# Patient Record
Sex: Female | Born: 1940 | Race: White | Hispanic: No | Smoking: Former smoker
Health system: Southern US, Community
[De-identification: ages and names within clinical notes are randomized; demographics above are authoritative.]

## PROBLEM LIST (undated history)

## (undated) DIAGNOSIS — H269 Unspecified cataract: Secondary | ICD-10-CM

## (undated) DIAGNOSIS — N189 Chronic kidney disease, unspecified: Secondary | ICD-10-CM

## (undated) DIAGNOSIS — I499 Cardiac arrhythmia, unspecified: Secondary | ICD-10-CM

## (undated) DIAGNOSIS — I10 Essential (primary) hypertension: Secondary | ICD-10-CM

## (undated) DIAGNOSIS — R112 Nausea with vomiting, unspecified: Secondary | ICD-10-CM

## (undated) DIAGNOSIS — E785 Hyperlipidemia, unspecified: Secondary | ICD-10-CM

## (undated) DIAGNOSIS — M1990 Unspecified osteoarthritis, unspecified site: Secondary | ICD-10-CM

## (undated) DIAGNOSIS — Z8719 Personal history of other diseases of the digestive system: Secondary | ICD-10-CM

## (undated) DIAGNOSIS — K219 Gastro-esophageal reflux disease without esophagitis: Secondary | ICD-10-CM

## (undated) DIAGNOSIS — T7840XA Allergy, unspecified, initial encounter: Secondary | ICD-10-CM

## (undated) DIAGNOSIS — Z87442 Personal history of urinary calculi: Secondary | ICD-10-CM

## (undated) DIAGNOSIS — Z9889 Other specified postprocedural states: Secondary | ICD-10-CM

## (undated) HISTORY — DX: Unspecified cataract: H26.9

## (undated) HISTORY — DX: Allergy, unspecified, initial encounter: T78.40XA

## (undated) HISTORY — DX: Essential (primary) hypertension: I10

## (undated) HISTORY — PX: BREAST BIOPSY: SHX20

## (undated) HISTORY — DX: Unspecified osteoarthritis, unspecified site: M19.90

## (undated) HISTORY — DX: Hyperlipidemia, unspecified: E78.5

## (undated) HISTORY — PX: ABDOMINAL HYSTERECTOMY: SHX81

## (undated) HISTORY — PX: REDUCTION MAMMAPLASTY: SUR839

## (undated) HISTORY — PX: BREAST EXCISIONAL BIOPSY: SUR124

---

## 1968-10-29 HISTORY — PX: REDUCTION MAMMAPLASTY: SUR839

## 1998-10-29 HISTORY — PX: BREAST EXCISIONAL BIOPSY: SUR124

## 2013-10-29 HISTORY — PX: APPENDECTOMY: SHX54

## 2018-10-29 HISTORY — PX: BREAST BIOPSY: SHX20

## 2021-12-28 ENCOUNTER — Encounter: Payer: Self-pay | Admitting: Internal Medicine

## 2021-12-28 ENCOUNTER — Other Ambulatory Visit: Payer: Self-pay

## 2021-12-28 ENCOUNTER — Ambulatory Visit (INDEPENDENT_AMBULATORY_CARE_PROVIDER_SITE_OTHER): Payer: Medicare Other | Admitting: Internal Medicine

## 2021-12-28 VITALS — BP 176/98 | HR 73 | Temp 97.9°F | Ht 65.0 in | Wt 136.0 lb

## 2021-12-28 DIAGNOSIS — I10 Essential (primary) hypertension: Secondary | ICD-10-CM | POA: Diagnosis not present

## 2021-12-28 DIAGNOSIS — J0100 Acute maxillary sinusitis, unspecified: Secondary | ICD-10-CM | POA: Diagnosis not present

## 2021-12-28 MED ORDER — INDAPAMIDE 1.25 MG PO TABS: 1.2500 mg | ORAL_TABLET | Freq: Every day | ORAL | 0 refills | Status: DC

## 2021-12-28 MED ORDER — OLMESARTAN MEDOXOMIL 20 MG PO TABS: 20.0000 mg | ORAL_TABLET | Freq: Every day | ORAL | 0 refills | Status: DC

## 2021-12-28 MED ORDER — METOPROLOL SUCCINATE 50 MG PO CS24: 1.0000 | EXTENDED_RELEASE_CAPSULE | Freq: Two times a day (BID) | ORAL | 0 refills | Status: DC

## 2021-12-28 MED ORDER — AMOXICILLIN-POT CLAVULANATE 875-125 MG PO TABS: 1.0000 | ORAL_TABLET | Freq: Two times a day (BID) | ORAL | 0 refills | Status: AC

## 2021-12-28 MED ORDER — AMLODIPINE BESYLATE 5 MG PO TABS: 5.0000 mg | ORAL_TABLET | Freq: Every day | ORAL | 0 refills | Status: DC

## 2021-12-28 NOTE — Progress Notes (Signed)
? ?Subjective:  ?Patient ID: Angel Kim, female    DOB: 1940-12-18  Age: 81 y.o. MRN: [id number] ? ?CC: Hypertension and Sinusitis ? ?This visit occurred during the SARS-CoV-2 public health emergency.  Safety protocols were in place, including screening questions prior to the visit, additional usage of staff PPE, and extensive cleaning of exam room while observing appropriate contact time as indicated for disinfecting solutions.   ? ?HPI ?Angel Kim presents for establishing. ? ?She moved from [facility] to the [place] area 3 months ago.  She tells me that she had labs done 3 months ago that were all okay.  She has no information with her today.  She complains of a 2-week history of thick, yellow, nasal phlegm with cough productive of yellow phlegm.  She denies headache, blurred vision, chest pain, shortness of breath, night sweats, fever, chills, or diaphoresis.  For blood pressure control she is taking metoprolol, losartan, and HCTZ as needed.  Amlodipine has been prescribed but she tells me she is not taking it. ? ?History ?Angel Kim has a past medical history of Arthritis and Hypertension.  ? ?She has a past surgical history that includes Abdominal hysterectomy.  ? ?Her family history includes Cancer in her sister; Coronary artery disease in her sister; Diabetes in her father and mother; Early death in her brother; Healthy in her sister; Heart attack in her father, mother, sister, and sister; Heart disease in her father, mother, and sister; Kidney failure in her sister.She reports that she has quit smoking. Her smoking use included cigarettes. She has never used smokeless tobacco. She reports current alcohol use. She reports that she does not use drugs. ? ?Outpatient Medications Prior to Visit  ?Medication Sig Dispense Refill  ? atorvastatin (LIPITOR) 40 MG tablet     ? estradiol (ESTRACE) 0.5 MG tablet     ? Fexofenadine HCl (ALLEGRA ALLERGY PO) Take by mouth.    ? Meloxicam 15 MG TBDP     ?  hydrochlorothiazide (HYDRODIURIL) 25 MG tablet     ? losartan (COZAAR) 50 MG tablet     ? Metoprolol Succinate 50 MG CS24     ? ?No facility-administered medications prior to visit.  ? ? ?ROS ?Review of Systems  ?Constitutional:  Negative for chills, diaphoresis, fatigue and fever.  ?HENT:  Positive for rhinorrhea, sinus pressure and sinus pain. Negative for sore throat and trouble swallowing.   ?Respiratory:  Negative for cough, chest tightness, shortness of breath and wheezing.   ?Cardiovascular:  Negative for chest pain, palpitations and leg swelling.  ?Gastrointestinal:  Negative for abdominal pain, constipation, diarrhea, nausea and vomiting.  ?Genitourinary: Negative.  Negative for difficulty urinating and dysuria.  ?Musculoskeletal: Negative.   ?Skin: Negative.  Negative for color change.  ?Neurological:  Negative for dizziness, weakness and headaches.  ?Hematological:  Negative for adenopathy. Does not bruise/bleed easily.  ?Psychiatric/Behavioral: Negative.    ? ?Objective:  ?BP (!) 176/98 (BP Location: Left Arm, Patient Position: Sitting, Cuff Size: Normal)   Pulse 73   Temp 97.9 ?F (36.6 ?C) (Oral)   Ht 5\' 5"  (1.651 m)   Wt 136 lb (61.7 kg)   SpO2 97%   BMI 22.63 kg/m?  ? ?Physical Exam ?Vitals reviewed.  ?Constitutional:   ?   General: She is not in acute distress. ?   Appearance: She is not ill-appearing, toxic-appearing or diaphoretic.  ?HENT:  ?   Nose: Nose normal.  ?   Mouth/Throat:  ?   Mouth: Mucous membranes are  moist.  ?Eyes:  ?   General: No scleral icterus. ?   Conjunctiva/sclera: Conjunctivae normal.  ?Cardiovascular:  ?   Rate and Rhythm: Normal rate and regular rhythm.  ?   Heart sounds: No murmur heard. ?Pulmonary:  ?   Effort: Pulmonary effort is normal.  ?   Breath sounds: No stridor. No wheezing, rhonchi or rales.  ?Abdominal:  ?   General: Abdomen is flat.  ?   Palpations: There is no mass.  ?   Tenderness: There is no abdominal tenderness. There is no guarding.  ?   Hernia: No  hernia is present.  ?Musculoskeletal:     ?   General: Normal range of motion.  ?   Cervical back: Neck supple.  ?   Right lower leg: No edema.  ?   Left lower leg: No edema.  ?Lymphadenopathy:  ?   Cervical: No cervical adenopathy.  ?Skin: ?   General: Skin is warm and dry.  ?Neurological:  ?   General: No focal deficit present.  ?   Mental Status: She is alert.  ?Psychiatric:     ?   Mood and Affect: Mood normal.     ?   Behavior: Behavior normal.  ? ? ?No results found for: WBC, HGB, HCT, PLT, GLUCOSE, CHOL, TRIG, HDL, LDLDIRECT, LDLCALC, ALT, AST, NA, K, CL, CREATININE, BUN, CO2, TSH, PSA, INR, GLUF, HGBA1C, MICROALBUR  ? ?Assessment & Plan:  ? ?Angel Kim was seen today for hypertension and sinusitis. ? ?Diagnoses and all orders for this visit: ? ?Subacute maxillary sinusitis- Will treat with a broad spectrum antibiotic. ?-     amoxicillin-clavulanate (AUGMENTIN) 875-125 MG tablet; Take 1 tablet by mouth 2 (two) times daily for 10 days. ? ?Primary hypertension- Her BP is not at goal. Will make some changes. She will get info from her previous PCP. ?-     amLODipine (NORVASC) 5 MG tablet; Take 1 tablet (5 mg total) by mouth daily. ?-     indapamide (LOZOL) 1.25 MG tablet; Take 1 tablet (1.25 mg total) by mouth daily. ?-     Metoprolol Succinate 50 MG CS24; Take 1 tablet by mouth 2 (two) times daily. ?-     olmesartan (BENICAR) 20 MG tablet; Take 1 tablet (20 mg total) by mouth daily. ? ? ?I have discontinued Angel Kim's hydrochlorothiazide and losartan. I have also changed her Metoprolol Succinate. Additionally, I am having her start on amoxicillin-clavulanate, amLODipine, indapamide, and olmesartan. Lastly, I am having her maintain her atorvastatin, estradiol, Meloxicam, and Fexofenadine HCl (ALLEGRA ALLERGY PO). ? ?Meds ordered this encounter  ?Medications  ? amoxicillin-clavulanate (AUGMENTIN) 875-125 MG tablet  ?  Sig: Take 1 tablet by mouth 2 (two) times daily for 10 days.  ?  Dispense:  20 tablet  ?  Refill:   0  ? amLODipine (NORVASC) 5 MG tablet  ?  Sig: Take 1 tablet (5 mg total) by mouth daily.  ?  Dispense:  90 tablet  ?  Refill:  0  ? indapamide (LOZOL) 1.25 MG tablet  ?  Sig: Take 1 tablet (1.25 mg total) by mouth daily.  ?  Dispense:  90 tablet  ?  Refill:  0  ? Metoprolol Succinate 50 MG CS24  ?  Sig: Take 1 tablet by mouth 2 (two) times daily.  ?  Dispense:  180 capsule  ?  Refill:  0  ? olmesartan (BENICAR) 20 MG tablet  ?  Sig: Take 1 tablet (20 mg total)  by mouth daily.  ?  Dispense:  90 tablet  ?  Refill:  0  ? ? ? ?Follow-up: Return in about 3 months (around 03/30/2022). ? ?Sanda Linger, MD ?

## 2021-12-28 NOTE — Patient Instructions (Signed)

## 2021-12-29 ENCOUNTER — Encounter: Payer: Self-pay | Admitting: Internal Medicine

## 2022-01-04 ENCOUNTER — Other Ambulatory Visit: Payer: Self-pay | Admitting: Internal Medicine

## 2022-01-04 ENCOUNTER — Other Ambulatory Visit: Payer: Self-pay

## 2022-01-04 DIAGNOSIS — M159 Polyosteoarthritis, unspecified: Secondary | ICD-10-CM

## 2022-01-04 DIAGNOSIS — N952 Postmenopausal atrophic vaginitis: Secondary | ICD-10-CM

## 2022-01-04 MED ORDER — ESTRADIOL 0.5 MG PO TABS
0.5000 mg | ORAL_TABLET | Freq: Every day | ORAL | 0 refills | Status: DC
  Filled 2022-01-04 – 2022-03-07 (×2): qty 90, 90d supply, fill #0

## 2022-01-04 MED ORDER — MELOXICAM 15 MG PO TBDP
1.0000 | ORAL_TABLET | Freq: Every day | ORAL | 0 refills | Status: DC
  Filled 2022-01-04: qty 90, fill #0

## 2022-02-15 ENCOUNTER — Encounter: Payer: Self-pay | Admitting: Internal Medicine

## 2022-02-15 ENCOUNTER — Other Ambulatory Visit: Payer: Self-pay | Admitting: Internal Medicine

## 2022-02-15 ENCOUNTER — Ambulatory Visit (INDEPENDENT_AMBULATORY_CARE_PROVIDER_SITE_OTHER): Payer: Medicare Other | Admitting: Internal Medicine

## 2022-02-15 VITALS — BP 142/82 | HR 63 | Temp 97.6°F | Ht 65.0 in | Wt 136.0 lb

## 2022-02-15 DIAGNOSIS — M1812 Unilateral primary osteoarthritis of first carpometacarpal joint, left hand: Secondary | ICD-10-CM | POA: Diagnosis not present

## 2022-02-15 DIAGNOSIS — E785 Hyperlipidemia, unspecified: Secondary | ICD-10-CM | POA: Insufficient documentation

## 2022-02-15 DIAGNOSIS — I10 Essential (primary) hypertension: Secondary | ICD-10-CM

## 2022-02-15 DIAGNOSIS — N1832 Chronic kidney disease, stage 3b: Secondary | ICD-10-CM | POA: Diagnosis not present

## 2022-02-15 DIAGNOSIS — R3129 Other microscopic hematuria: Secondary | ICD-10-CM

## 2022-02-15 LAB — CBC WITH DIFFERENTIAL/PLATELET
Basophils Absolute: 0.1 10*3/uL (ref 0.0–0.1)
Basophils Relative: 1.2 % (ref 0.0–3.0)
Eosinophils Absolute: 0.5 10*3/uL (ref 0.0–0.7)
Eosinophils Relative: 6 % — ABNORMAL HIGH (ref 0.0–5.0)
HCT: 39.9 % (ref 36.0–46.0)
Hemoglobin: 13.6 g/dL (ref 12.0–15.0)
Lymphocytes Relative: 28.7 % (ref 12.0–46.0)
Lymphs Abs: 2.2 10*3/uL (ref 0.7–4.0)
MCHC: 34.2 g/dL (ref 30.0–36.0)
MCV: 85.2 fl (ref 78.0–100.0)
Monocytes Absolute: 0.6 10*3/uL (ref 0.1–1.0)
Monocytes Relative: 7.9 % (ref 3.0–12.0)
Neutro Abs: 4.3 10*3/uL (ref 1.4–7.7)
Neutrophils Relative %: 56.2 % (ref 43.0–77.0)
Platelets: 227 10*3/uL (ref 150.0–400.0)
RBC: 4.68 Mil/uL (ref 3.87–5.11)
RDW: 13.7 % (ref 11.5–15.5)
WBC: 7.7 10*3/uL (ref 4.0–10.5)

## 2022-02-15 LAB — LIPID PANEL
Cholesterol: 179 mg/dL (ref 0–200)
HDL: 67.3 mg/dL (ref >39.00)
LDL Cholesterol: 94 mg/dL (ref 0–99)
NonHDL: 111.61
Total CHOL/HDL Ratio: 3
Triglycerides: 89 mg/dL (ref 0.0–149.0)
VLDL: 17.8 mg/dL (ref 0.0–40.0)

## 2022-02-15 LAB — BASIC METABOLIC PANEL
BUN: 38 mg/dL — ABNORMAL HIGH (ref 6–23)
CO2: 30 mEq/L (ref 19–32)
Calcium: 9.9 mg/dL (ref 8.4–10.5)
Chloride: 104 mEq/L (ref 96–112)
Creatinine, Ser: 0.97 mg/dL (ref 0.40–1.20)
GFR: 55.81 mL/min — ABNORMAL LOW (ref >60.00)
Glucose, Bld: 100 mg/dL — ABNORMAL HIGH (ref 70–99)
Potassium: 4.3 mEq/L (ref 3.5–5.1)
Sodium: 139 mEq/L (ref 135–145)

## 2022-02-15 LAB — URINALYSIS, ROUTINE W REFLEX MICROSCOPIC
Bilirubin Urine: NEGATIVE
Ketones, ur: NEGATIVE
Nitrite: NEGATIVE
Specific Gravity, Urine: 1.01 (ref 1.000–1.030)
Total Protein, Urine: NEGATIVE
Urine Glucose: NEGATIVE
Urobilinogen, UA: 0.2 (ref 0.0–1.0)
pH: 6 (ref 5.0–8.0)

## 2022-02-15 LAB — TSH: TSH: 2.15 u[IU]/mL (ref 0.35–5.50)

## 2022-02-15 LAB — HEPATIC FUNCTION PANEL
ALT: 25 U/L (ref 0–35)
AST: 22 U/L (ref 0–37)
Albumin: 4.4 g/dL (ref 3.5–5.2)
Alkaline Phosphatase: 66 U/L (ref 39–117)
Bilirubin, Direct: 0.1 mg/dL (ref 0.0–0.3)
Total Bilirubin: 0.5 mg/dL (ref 0.2–1.2)
Total Protein: 7.3 g/dL (ref 6.0–8.3)

## 2022-02-15 NOTE — Patient Instructions (Signed)
Hypertension, Adult High blood pressure (hypertension) is when the force of blood pumping through the arteries is too strong. The arteries are the blood vessels that carry blood from the heart throughout the body. Hypertension forces the heart to work harder to pump blood and may cause arteries to become narrow or stiff. Untreated or uncontrolled hypertension can lead to a heart attack, heart failure, a stroke, kidney disease, and other problems. A blood pressure reading consists of a higher number over a lower number. Ideally, your blood pressure should be below 120/80. The first ("top") number is called the systolic pressure. It is a measure of the pressure in your arteries as your heart beats. The second ("bottom") number is called the diastolic pressure. It is a measure of the pressure in your arteries as the heart relaxes. What are the causes? The exact cause of this condition is not known. There are some conditions that result in high blood pressure. What increases the risk? Certain factors may make you more likely to develop high blood pressure. Some of these risk factors are under your control, including: Smoking. Not getting enough exercise or physical activity. Being overweight. Having too much fat, sugar, calories, or salt (sodium) in your diet. Drinking too much alcohol. Other risk factors include: Having a personal history of heart disease, diabetes, high cholesterol, or kidney disease. Stress. Having a family history of high blood pressure and high cholesterol. Having obstructive sleep apnea. Age. The risk increases with age. What are the signs or symptoms? High blood pressure may not cause symptoms. Very high blood pressure (hypertensive crisis) may cause: Headache. Fast or irregular heartbeats (palpitations). Shortness of breath. Nosebleed. Nausea and vomiting. Vision changes. Severe chest pain, dizziness, and seizures. How is this diagnosed? This condition is diagnosed by  measuring your blood pressure while you are seated, with your arm resting on a flat surface, your legs uncrossed, and your feet flat on the floor. The cuff of the blood pressure monitor will be placed directly against the skin of your upper arm at the level of your heart. Blood pressure should be measured at least twice using the same arm. Certain conditions can cause a difference in blood pressure between your right and left arms. If you have a high blood pressure reading during one visit or you have normal blood pressure with other risk factors, you may be asked to: Return on a different day to have your blood pressure checked again. Monitor your blood pressure at home for 1 week or longer. If you are diagnosed with hypertension, you may have other blood or imaging tests to help your health care provider understand your overall risk for other conditions. How is this treated? This condition is treated by making healthy lifestyle changes, such as eating healthy foods, exercising more, and reducing your alcohol intake. You may be referred for counseling on a healthy diet and physical activity. Your health care provider may prescribe medicine if lifestyle changes are not enough to get your blood pressure under control and if: Your systolic blood pressure is above 130. Your diastolic blood pressure is above 80. Your personal target blood pressure may vary depending on your medical conditions, your age, and other factors. Follow these instructions at home: Eating and drinking  Eat a diet that is high in fiber and potassium, and low in sodium, added sugar, and fat. An example of this eating plan is called the DASH diet. DASH stands for Dietary Approaches to Stop Hypertension. To eat this way: Eat   plenty of fresh fruits and vegetables. Try to fill one half of your plate at each meal with fruits and vegetables. Eat whole grains, such as whole-wheat pasta, brown rice, or whole-grain bread. Fill about one  fourth of your plate with whole grains. Eat or drink low-fat dairy products, such as skim milk or low-fat yogurt. Avoid fatty cuts of meat, processed or cured meats, and poultry with skin. Fill about one fourth of your plate with lean proteins, such as fish, chicken without skin, beans, eggs, or tofu. Avoid pre-made and processed foods. These tend to be higher in sodium, added sugar, and fat. Reduce your daily sodium intake. Many people with hypertension should eat less than 1,500 mg of sodium a day. Do not drink alcohol if: Your health care provider tells you not to drink. You are pregnant, may be pregnant, or are planning to become pregnant. If you drink alcohol: Limit how much you have to: 0-1 drink a day for women. 0-2 drinks a day for men. Know how much alcohol is in your drink. In the [place] one drink equals one 12 oz bottle of beer (355 mL), one 5 oz glass of wine (148 mL), or one 1 oz glass of hard liquor (44 mL). Lifestyle  Work with your health care provider to maintain a healthy body weight or to lose weight. Ask what an ideal weight is for you. Get at least 30 minutes of exercise that causes your heart to beat faster (aerobic exercise) most days of the week. Activities may include walking, swimming, or biking. Include exercise to strengthen your muscles (resistance exercise), such as Pilates or lifting weights, as part of your weekly exercise routine. Try to do these types of exercises for 30 minutes at least 3 days a week. Do not use any products that contain nicotine or tobacco. These products include cigarettes, chewing tobacco, and vaping devices, such as e-cigarettes. If you need help quitting, ask your health care provider. Monitor your blood pressure at home as told by your health care provider. Keep all follow-up visits. This is important. Medicines Take over-the-counter and prescription medicines only as told by your health care provider. Follow directions carefully. Blood  pressure medicines must be taken as prescribed. Do not skip doses of blood pressure medicine. Doing this puts you at risk for problems and can make the medicine less effective. Ask your health care provider about side effects or reactions to medicines that you should watch for. Contact a health care provider if you: Think you are having a reaction to a medicine you are taking. Have headaches that keep coming back (recurring). Feel dizzy. Have swelling in your ankles. Have trouble with your vision. Get help right away if you: Develop a severe headache or confusion. Have unusual weakness or numbness. Feel faint. Have severe pain in your chest or abdomen. Vomit repeatedly. Have trouble breathing. These symptoms may be an emergency. Get help right away. Call 911. Do not wait to see if the symptoms will go away. Do not drive yourself to the hospital. Summary Hypertension is when the force of blood pumping through your arteries is too strong. If this condition is not controlled, it may put you at risk for serious complications. Your personal target blood pressure may vary depending on your medical conditions, your age, and other factors. For most people, a normal blood pressure is less than 120/80. Hypertension is treated with lifestyle changes, medicines, or a combination of both. Lifestyle changes include losing weight, eating a healthy,   low-sodium diet, exercising more, and limiting alcohol. This information is not intended to replace advice given to you by your health care provider. Make sure you discuss any questions you have with your health care provider. Document Revised: 08/22/2021 Document Reviewed: 08/22/2021 Elsevier Patient Education  2023 [employer]  

## 2022-02-15 NOTE — Progress Notes (Signed)
? ?Subjective:  ?Patient ID: Angel Kim, female    DOB: Jan 09, 1941  Age: 81 y.o. MRN: [id number] ? ?CC: Hypertension and Osteoarthritis ? ? ?HPI ?Angel Kim presents for f/up -  ? ?She is active and denies DOE, SOB, CP, edema. ? ?Outpatient Medications Prior to Visit  ?Medication Sig Dispense Refill  ? amLODipine (NORVASC) 5 MG tablet Take 1 tablet (5 mg total) by mouth daily. 90 tablet 0  ? estradiol (ESTRACE) 0.5 MG tablet Take 1 tablet (0.5 mg total) by mouth daily. 90 tablet 0  ? Fexofenadine HCl (ALLEGRA ALLERGY PO) Take by mouth.    ? indapamide (LOZOL) 1.25 MG tablet Take 1 tablet (1.25 mg total) by mouth daily. 90 tablet 0  ? Metoprolol Succinate 50 MG CS24 Take 1 tablet by mouth 2 (two) times daily. 180 capsule 0  ? olmesartan (BENICAR) 20 MG tablet Take 1 tablet (20 mg total) by mouth daily. 90 tablet 0  ? atorvastatin (LIPITOR) 40 MG tablet     ? Meloxicam 15 MG TBDP Take 1 tablet by mouth daily. 90 tablet 0  ? ?No facility-administered medications prior to visit.  ? ? ?ROS ?Review of Systems  ?Constitutional: Negative.  Negative for diaphoresis and fatigue.  ?HENT: Negative.    ?Eyes: Negative.   ?Respiratory:  Negative for cough, chest tightness, shortness of breath and wheezing.   ?Cardiovascular:  Negative for chest pain, palpitations and leg swelling.  ?Gastrointestinal:  Negative for abdominal pain, constipation, diarrhea, nausea and vomiting.  ?Endocrine: Negative.   ?Genitourinary: Negative.  Negative for dysuria and hematuria.  ?Musculoskeletal:  Positive for arthralgias. Negative for back pain.  ?Skin: Negative.   ?Neurological:  Negative for dizziness, weakness and light-headedness.  ?Hematological:  Negative for adenopathy. Does not bruise/bleed easily.  ?Psychiatric/Behavioral: Negative.    ? ?Objective:  ?BP (!) 142/82 (BP Location: Right Arm)   Pulse 63   Temp 97.6 ?F (36.4 ?C) (Oral)   Ht 5\' 5"  (1.651 m)   Wt 136 lb (61.7 kg)   SpO2 95%   BMI 22.63 kg/m?  ? ?BP Readings from Last  3 Encounters:  ?02/15/22 (!) 142/82  ?12/28/21 (!) 176/98  ? ? ?Wt Readings from Last 3 Encounters:  ?02/15/22 136 lb (61.7 kg)  ?12/28/21 136 lb (61.7 kg)  ? ? ?Physical Exam ?Vitals reviewed.  ?HENT:  ?   Nose: Nose normal.  ?   Mouth/Throat:  ?   Mouth: Mucous membranes are moist.  ?Eyes:  ?   General: No scleral icterus. ?   Conjunctiva/sclera: Conjunctivae normal.  ?Cardiovascular:  ?   Rate and Rhythm: Normal rate and regular rhythm.  ?   Heart sounds: No murmur heard. ?Pulmonary:  ?   Effort: Pulmonary effort is normal.  ?   Breath sounds: No stridor. No wheezing, rhonchi or rales.  ?Abdominal:  ?   General: Abdomen is flat.  ?   Palpations: There is no mass.  ?   Tenderness: There is no abdominal tenderness. There is no guarding.  ?   Hernia: No hernia is present.  ?Musculoskeletal:     ?   General: Deformity present. No swelling.  ?   Cervical back: Neck supple.  ?   Right lower leg: No edema.  ?   Left lower leg: No edema.  ?   Comments: +++ spurring in B 1st CMC joints L>>>R  ?Lymphadenopathy:  ?   Cervical: No cervical adenopathy.  ?Skin: ?   General: Skin is warm and dry.  ?  Findings: No lesion.  ?Neurological:  ?   General: No focal deficit present.  ?   Mental Status: She is alert.  ?Psychiatric:     ?   Mood and Affect: Mood normal.     ?   Behavior: Behavior normal.  ? ? ?Lab Results  ?Component Value Date  ? WBC 7.7 02/15/2022  ? HGB 13.6 02/15/2022  ? HCT 39.9 02/15/2022  ? PLT 227.0 02/15/2022  ? GLUCOSE 100 (H) 02/15/2022  ? CHOL 179 02/15/2022  ? TRIG 89.0 02/15/2022  ? HDL 67.30 02/15/2022  ? [place] 94 02/15/2022  ? ALT 25 02/15/2022  ? AST 22 02/15/2022  ? NA 139 02/15/2022  ? K 4.3 02/15/2022  ? CL 104 02/15/2022  ? CREATININE 0.97 02/15/2022  ? BUN 38 (H) 02/15/2022  ? CO2 30 02/15/2022  ? TSH 2.15 02/15/2022  ? ? ?Patient was never admitted. ? ?Assessment & Plan:  ? ?Sheniyah was seen today for hypertension and osteoarthritis. ? ?Diagnoses and all orders for this visit: ? ?Primary  hypertension- Her BP is well controlled ?-     CBC with Differential/Platelet; Future ?-     Hepatic function panel; Future ?-     TSH; Future ?-     Urinalysis, Routine w reflex microscopic; Future ?-     Basic metabolic panel; Future ?-     Basic metabolic panel ?-     Urinalysis, Routine w reflex microscopic ?-     TSH ?-     Hepatic function panel ?-     CBC with Differential/Platelet ? ?Hyperlipidemia LDL goal <100- LDL goal achieved. Doing well on the statin  ?-     Lipid panel; Future ?-     Hepatic function panel; Future ?-     TSH; Future ?-     TSH ?-     Hepatic function panel ?-     Lipid panel ?-     atorvastatin (LIPITOR) 40 MG tablet; Take 1 tablet (40 mg total) by mouth daily. ? ?Primary osteoarthritis of first carpometacarpal joint of left hand ?-     Ambulatory referral to Orthopedic Surgery ? ?Stage 3b chronic kidney disease ([place])- Will discontinue the nsaid. ?-     CT RENAL STONE STUDY; Future ? ?Hematuria, microscopic- Will evaluate for renal pathology. ?-     CT RENAL STONE STUDY; Future ? ? ?I have discontinued Izora Gala Attig's Meloxicam. I have also changed her atorvastatin. Additionally, I am having her maintain her Fexofenadine HCl (ALLEGRA ALLERGY PO), amLODipine, indapamide, Metoprolol Succinate, olmesartan, and estradiol. ? ?Meds ordered this encounter  ?Medications  ? atorvastatin (LIPITOR) 40 MG tablet  ?  Sig: Take 1 tablet (40 mg total) by mouth daily.  ?  Dispense:  90 tablet  ?  Refill:  1  ? ? ? ?Follow-up: Return in about 6 months (around 08/17/2022). ? ?Scarlette Calico, MD ?

## 2022-02-16 MED ORDER — ATORVASTATIN CALCIUM 40 MG PO TABS: 40.0000 mg | ORAL_TABLET | Freq: Every day | ORAL | 1 refills | Status: DC

## 2022-02-16 NOTE — Telephone Encounter (Signed)
LOV 02/15/22

## 2022-02-19 ENCOUNTER — Other Ambulatory Visit: Payer: Self-pay | Admitting: Internal Medicine

## 2022-02-19 ENCOUNTER — Ambulatory Visit
Admission: RE | Admit: 2022-02-19 | Discharge: 2022-02-19 | Disposition: A | Discharge status: Home / Self Care | Payer: Medicare Other | Source: Ambulatory Visit | Attending: Internal Medicine | Admitting: Internal Medicine

## 2022-02-19 DIAGNOSIS — N1832 Chronic kidney disease, stage 3b: Secondary | ICD-10-CM

## 2022-02-19 DIAGNOSIS — R3129 Other microscopic hematuria: Secondary | ICD-10-CM

## 2022-02-19 DIAGNOSIS — N200 Calculus of kidney: Secondary | ICD-10-CM | POA: Insufficient documentation

## 2022-02-21 ENCOUNTER — Encounter: Payer: Self-pay | Admitting: Internal Medicine

## 2022-03-07 ENCOUNTER — Other Ambulatory Visit: Payer: Self-pay

## 2022-03-07 ENCOUNTER — Ambulatory Visit (INDEPENDENT_AMBULATORY_CARE_PROVIDER_SITE_OTHER): Payer: Medicare Other

## 2022-03-07 DIAGNOSIS — Z0000 Encounter for general adult medical examination without abnormal findings: Secondary | ICD-10-CM | POA: Diagnosis not present

## 2022-03-07 NOTE — Patient Instructions (Signed)
Angel Kim , ?Thank you for taking time to come for your Medicare Wellness Visit. I appreciate your ongoing commitment to your health goals. Please review the following plan we discussed and let me know if I can assist you in the future.  ? ?Screening recommendations/referrals: ?Colonoscopy: awaiting records from previous provider ?Mammogram: awaiting records from previous provider ?Bone Density: awaiting records from previous provider ?Recommended yearly ophthalmology/optometry visit for glaucoma screening and checkup ?Recommended yearly dental visit for hygiene and checkup ? ?Vaccinations: ?Influenza vaccine:  ?Pneumococcal vaccine: awaiting records from previous provider ?Tdap vaccine: awaiting records from previous provider ?Shingles vaccine: awaiting records from previous provider   ?Covid-19: 11/23/2019, 12/14/2019, 07/30/2020, 06/07/2021 ? ?Advanced directives: Yes; Please bring a copy of your health care power of attorney and living will to the office at your convenience. ? ?Conditions/risks identified: Yes ? ?Next appointment: Please schedule your next Medicare Wellness Visit with your Nurse Health Advisor in 1 year by calling [phone]. ? ? ?Preventive Care 32 Years and Older, Female ?Preventive care refers to lifestyle choices and visits with your health care provider that can promote health and wellness. ?What does preventive care include? ?A yearly physical exam. This is also called an annual well check. ?Dental exams once or twice a year. ?Routine eye exams. Ask your health care provider how often you should have your eyes checked. ?Personal lifestyle choices, including: ?Daily care of your teeth and gums. ?Regular physical activity. ?Eating a healthy diet. ?Avoiding tobacco and drug use. ?Limiting alcohol use. ?Practicing safe sex. ?Taking low-dose aspirin every day. ?Taking vitamin and mineral supplements as recommended by your health care provider. ?What happens during an annual well check? ?The  services and screenings done by your health care provider during your annual well check will depend on your age, overall health, lifestyle risk factors, and family history of disease. ?Counseling  ?Your health care provider may ask you questions about your: ?Alcohol use. ?Tobacco use. ?Drug use. ?Emotional well-being. ?Home and relationship well-being. ?Sexual activity. ?Eating habits. ?History of falls. ?Memory and ability to understand (cognition). ?Work and work Astronomer. ?Reproductive health. ?Screening  ?You may have the following tests or measurements: ?Height, weight, and BMI. ?Blood pressure. ?Lipid and cholesterol levels. These may be checked every 5 years, or more frequently if you are over 41 years old. ?Skin check. ?Lung cancer screening. You may have this screening every year starting at age 16 if you have a 30-pack-year history of smoking and currently smoke or have quit within the past 15 years. ?Fecal occult blood test (FOBT) of the stool. You may have this test every year starting at age 6. ?Flexible sigmoidoscopy or colonoscopy. You may have a sigmoidoscopy every 5 years or a colonoscopy every 10 years starting at age 40. ?Hepatitis C blood test. ?Hepatitis B blood test. ?Sexually transmitted disease (STD) testing. ?Diabetes screening. This is done by checking your blood sugar (glucose) after you have not eaten for a while (fasting). You may have this done every 1-3 years. ?Bone density scan. This is done to screen for osteoporosis. You may have this done starting at age 54. ?Mammogram. This may be done every 1-2 years. Talk to your health care provider about how often you should have regular mammograms. ?Talk with your health care provider about your test results, treatment options, and if necessary, the need for more tests. ?Vaccines  ?Your health care provider may recommend certain vaccines, such as: ?Influenza vaccine. This is recommended every year. ?Tetanus, diphtheria, and acellular  pertussis (  Tdap, Td) vaccine. You may need a Td booster every 10 years. ?Zoster vaccine. You may need this after age 79. ?Pneumococcal 13-valent conjugate (PCV13) vaccine. One dose is recommended after age 79. ?Pneumococcal polysaccharide (PPSV23) vaccine. One dose is recommended after age 79. ?Talk to your health care provider about which screenings and vaccines you need and how often you need them. ?This information is not intended to replace advice given to you by your health care provider. Make sure you discuss any questions you have with your health care provider. ?Document Released: 11/11/2015 Document Revised: 07/04/2016 Document Reviewed: 08/16/2015 ?Elsevier Interactive Patient Education ? 2017 [employer] ? ?Fall Prevention in the Home ?Falls can cause injuries. They can happen to people of all ages. There are many things you can do to make your home safe and to help prevent falls. ?What can I do on the outside of my home? ?Regularly fix the edges of walkways and driveways and fix any cracks. ?Remove anything that might make you trip as you walk through a door, such as a raised step or threshold. ?Trim any bushes or trees on the path to your home. ?Use bright outdoor lighting. ?Clear any walking paths of anything that might make someone trip, such as rocks or tools. ?Regularly check to see if handrails are loose or broken. Make sure that both sides of any steps have handrails. ?Any raised decks and porches should have guardrails on the edges. ?Have any leaves, snow, or ice cleared regularly. ?Use sand or salt on walking paths during winter. ?Clean up any spills in your garage right away. This includes oil or grease spills. ?What can I do in the bathroom? ?Use night lights. ?Install grab bars by the toilet and in the tub and shower. Do not use towel bars as grab bars. ?Use non-skid mats or decals in the tub or shower. ?If you need to sit down in the shower, use a plastic, non-slip stool. ?Keep the floor  dry. Clean up any water that spills on the floor as soon as it happens. ?Remove soap buildup in the tub or shower regularly. ?Attach bath mats securely with double-sided non-slip rug tape. ?Do not have throw rugs and other things on the floor that can make you trip. ?What can I do in the bedroom? ?Use night lights. ?Make sure that you have a light by your bed that is easy to reach. ?Do not use any sheets or blankets that are too big for your bed. They should not hang down onto the floor. ?Have a firm chair that has side arms. You can use this for support while you get dressed. ?Do not have throw rugs and other things on the floor that can make you trip. ?What can I do in the kitchen? ?Clean up any spills right away. ?Avoid walking on wet floors. ?Keep items that you use a lot in easy-to-reach places. ?If you need to reach something above you, use a strong step stool that has a grab bar. ?Keep electrical cords out of the way. ?Do not use floor polish or wax that makes floors slippery. If you must use wax, use non-skid floor wax. ?Do not have throw rugs and other things on the floor that can make you trip. ?What can I do with my stairs? ?Do not leave any items on the stairs. ?Make sure that there are handrails on both sides of the stairs and use them. Fix handrails that are broken or loose. Make sure that handrails are as  long as the stairways. ?Check any carpeting to make sure that it is firmly attached to the stairs. Fix any carpet that is loose or worn. ?Avoid having throw rugs at the top or bottom of the stairs. If you do have throw rugs, attach them to the floor with carpet tape. ?Make sure that you have a light switch at the top of the stairs and the bottom of the stairs. If you do not have them, ask someone to add them for you. ?What else can I do to help prevent falls? ?Wear shoes that: ?Do not have high heels. ?Have rubber bottoms. ?Are comfortable and fit you well. ?Are closed at the toe. Do not wear  sandals. ?If you use a stepladder: ?Make sure that it is fully opened. Do not climb a closed stepladder. ?Make sure that both sides of the stepladder are locked into place. ?Ask someone to hold it for you, if possibl

## 2022-03-07 NOTE — Progress Notes (Signed)
?I connected with Barnett Applebaum today by telephone and verified that I am speaking with the correct person using two identifiers. ?Location patient: home ?Location provider: work ?Persons participating in the virtual visit: patient, provider. ?  ?I discussed the limitations, risks, security and privacy concerns of performing an evaluation and management service by telephone and the availability of in person appointments. I also discussed with the patient that there may be a patient responsible charge related to this service. The patient expressed understanding and verbally consented to this telephonic visit.  ?  ?Interactive audio and video telecommunications were attempted between this provider and patient, however failed, due to patient having technical difficulties OR patient did not have access to video capability.  We continued and completed visit with audio only. ? ?Some vital signs may be absent or patient reported.  ? ?Time Spent with patient on telephone encounter: 30 minutes ? ?Subjective:  ? Angel Kim is a 81 y.o. female who presents for Medicare Annual (Subsequent) preventive examination. ? ?Review of Systems    ? ?Cardiac Risk Factors include: advanced age (>90men, >38 women);dyslipidemia;hypertension;family history of premature cardiovascular disease ? ?   ?Objective:  ?  ?There were no vitals filed for this visit. ?There is no height or weight on file to calculate BMI. ? ? ?  03/07/2022  ?  4:07 PM  ?Advanced Directives  ?Does Patient Have a Medical Advance Directive? Yes  ?Type of Advance Directive Living will;Healthcare Power of Attorney  ?Does patient want to make changes to medical advance directive? No - Patient declined  ?Copy of [place] in Chart? No - copy requested  ? ? ?Current Medications (verified) ?Outpatient Encounter Medications as of 03/07/2022  ?Medication Sig  ? amLODipine (NORVASC) 5 MG tablet Take 1 tablet (5 mg total) by mouth daily.  ? atorvastatin (LIPITOR)  40 MG tablet Take 1 tablet (40 mg total) by mouth daily.  ? estradiol (ESTRACE) 0.5 MG tablet Take 1 tablet (0.5 mg total) by mouth daily.  ? Fexofenadine HCl (ALLEGRA ALLERGY PO) Take by mouth.  ? indapamide (LOZOL) 1.25 MG tablet Take 1 tablet (1.25 mg total) by mouth daily.  ? Metoprolol Succinate 50 MG CS24 Take 1 tablet by mouth 2 (two) times daily.  ? olmesartan (BENICAR) 20 MG tablet Take 1 tablet (20 mg total) by mouth daily.  ? ?No facility-administered encounter medications on file as of 03/07/2022.  ? ? ?Allergies (verified) ?Ciprofloxacin and Ofloxacin  ? ?History: ?Past Medical History:  ?Diagnosis Date  ? Arthritis   ? Hypertension   ? ?Past Surgical History:  ?Procedure Laterality Date  ? ABDOMINAL HYSTERECTOMY    ? ?Family History  ?Problem Relation Age of Onset  ? Diabetes Mother   ? Heart disease Mother   ? Heart attack Mother   ? Diabetes Father   ? Heart disease Father   ? Heart attack Father   ? Heart attack Sister   ? Heart disease Sister   ? Coronary artery disease Sister   ? Kidney failure Sister   ? Heart attack Sister   ? Cancer Sister   ?     breast  ? Healthy Sister   ? Early death Brother   ? ?Social History  ? ?Socioeconomic History  ? Marital status: Widowed  ?  Spouse name: Not on file  ? Number of children: Not on file  ? Years of education: Not on file  ? Highest education level: Not on file  ?Occupational History  ?  Not on file  ?Tobacco Use  ? Smoking status: Former  ?  Types: Cigarettes  ? Smokeless tobacco: Never  ?Substance and Sexual Activity  ? Alcohol use: Not on file  ? Drug use: Never  ? Sexual activity: Not Currently  ?  Partners: Male  ?Other Topics Concern  ? Not on file  ?Social History Narrative  ? Not on file  ? ?Social Determinants of Health  ? ?Financial Resource Strain: Low Risk   ? Difficulty of Paying Living Expenses: Not hard at all  ?Food Insecurity: No Food Insecurity  ? Worried About Charity fundraiser in the Last Year: Never true  ? Ran Out of Food in the  Last Year: Never true  ?Transportation Needs: No Transportation Needs  ? Lack of Transportation (Medical): No  ? Lack of Transportation (Non-Medical): No  ?Physical Activity: Not on file  ?Stress: No Stress Concern Present  ? Feeling of Stress : Not at all  ?Social Connections: Moderately Integrated  ? Frequency of Communication with Friends and Family: More than three times a week  ? Frequency of Social Gatherings with Friends and Family: More than three times a week  ? Attends Religious Services: More than 4 times per year  ? Active Member of Clubs or Organizations: Yes  ? Attends Archivist Meetings: More than 4 times per year  ? Marital Status: Widowed  ? ? ?Tobacco Counseling ?Counseling given: Not Answered ? ? ?Clinical Intake: ? ?Pre-visit preparation completed: Yes ? ?Pain : No/denies pain ? ?  ? ?Nutritional Risks: None ?Diabetes: No ? ?How often do you need to have someone help you when you read instructions, pamphlets, or other written materials from your doctor or pharmacy?: 1 - Never ?What is the last grade level you completed in school?: Some collge classes ? ?Diabetic? no ? ?Interpreter Needed?: No ? ?Information entered by :: Lisette Abu, LPN. ? ? ?Activities of Daily Living ? ?  03/07/2022  ?  4:19 PM 03/06/2022  ?  4:52 PM  ?In your present state of health, do you have any difficulty performing the following activities:  ?Hearing? 0 0  ?Vision? 0 0  ?Difficulty concentrating or making decisions? 0 0  ?Walking or climbing stairs? 0 0  ?Dressing or bathing? 0 0  ?Doing errands, shopping? 0 0  ?Preparing Food and eating ? N N  ?Using the Toilet? N N  ?In the past six months, have you accidently leaked urine? N N  ?Do you have problems with loss of bowel control? N N  ?Managing your Medications? N N  ?Managing your Finances? N N  ?Housekeeping or managing your Housekeeping? N N  ? ? ?Patient Care Team: ?Janith Lima, MD as PCP - General (Internal Medicine) ? ?Indicate any recent Medical  Services you may have received from other than [facility] providers in the past year (date may be approximate). ? ?   ?Assessment:  ? This is a routine wellness examination for Nelson. ? ?Hearing/Vision screen ?Hearing Screening - Comments:: Patient denied any hearing difficulty.   ?No hearing aids. ? ?Vision Screening - Comments:: Patient does readers for small print. ?Eye exam done by: Optometrist in [place] ? ? ?Dietary issues and exercise activities discussed: ?Current Exercise Habits: Home exercise routine, Type of exercise: walking, Time (Minutes): 30, Frequency (Times/Week): 5, Weekly Exercise (Minutes/Week): 150, Intensity: Moderate, Exercise limited by: None identified ? ? Goals Addressed   ? ?  ?  ?  ?  ?  This Visit's Progress  ?  To maintain my current health status by continuing to eat healthy, stay physically active and socially active.     ? ?  ?Depression Screen ? ?  03/07/2022  ?  4:08 PM 12/28/2021  ?  3:13 PM  ?PHQ 2/9 Scores  ?PHQ - 2 Score 0 4  ?  ?Fall Risk ? ?  03/07/2022  ?  4:09 PM 03/06/2022  ?  4:52 PM  ?Fall Risk   ?Falls in the past year? 0 0  ?Number falls in past yr: 0 0  ?Injury with Fall? 0 0  ?Risk for fall due to : No Fall Risks   ?Follow up Falls evaluation completed   ? ? ?FALL RISK PREVENTION PERTAINING TO THE HOME: ? ?Any stairs in or around the home? No  ?If so, are there any without handrails? No  ?Home free of loose throw rugs in walkways, pet beds, electrical cords, etc? Yes  ?Adequate lighting in your home to reduce risk of falls? Yes  ? ?ASSISTIVE DEVICES UTILIZED TO PREVENT FALLS: ? ?Life alert? Yes  ?Use of a cane, walker or w/c? No  ?Grab bars in the bathroom? Yes  ?Shower chair or bench in shower? Yes  ?Elevated toilet seat or a handicapped toilet? Yes  ? ?TIMED UP AND GO: ? ?Was the test performed? No .  ?Length of time to ambulate 10 feet: n/a sec.  ? ?Appearance of gait: Patient not evaluated for gait during this visit. ? ?Cognitive Function: ?  ?  ? ?  03/07/2022  ?  4:20  PM  ?6CIT Screen  ?What Year? 0 points  ?What month? 0 points  ?What time? 0 points  ?Count back from 20 0 points  ?Months in reverse 0 points  ?Repeat phrase 0 points  ?Total Score 0 points  ? ? ?Imm

## 2022-03-13 ENCOUNTER — Other Ambulatory Visit: Payer: Self-pay | Admitting: Urology

## 2022-03-19 ENCOUNTER — Other Ambulatory Visit: Payer: Self-pay

## 2022-03-20 ENCOUNTER — Other Ambulatory Visit: Payer: Self-pay | Admitting: Internal Medicine

## 2022-03-20 DIAGNOSIS — I10 Essential (primary) hypertension: Secondary | ICD-10-CM

## 2022-03-27 ENCOUNTER — Encounter (HOSPITAL_BASED_OUTPATIENT_CLINIC_OR_DEPARTMENT_OTHER): Payer: Self-pay | Admitting: Urology

## 2022-03-27 NOTE — Progress Notes (Signed)
Spoke w/ via phone for pre-op interview--- patient Lab needs dos---- Istat, EKG             Lab results------ in EPIC COVID test -----patient states asymptomatic no test needed Arrive at ------- 0845 on 03/30/2022 NPO after MN NO Solid Food.  Clear liquids from MN until--- 0745 Med rec completed Medications to take morning of surgery ----- norvasc, lipitor, estrace, allegra, metoprolol Diabetic medication ----- n/a Patient instructed no nail polish to be worn day of surgery Patient instructed to bring photo id and insurance card day of surgery Patient aware to have Driver (ride ) / caregiver    for 24 hours after surgery -- Kashmere Daywalt (son) [phone] Patient Special Instructions ----- none Pre-Op special Istructions ----- none Patient verbalized understanding of instructions that were given at this phone interview. Patient denies shortness of breath, chest pain, fever, cough at this phone interview.  DOS do not take: benicar, lozol, ASA 81 mg (pt reports she has already stopped taking).   Pt reports PMH of irregular heart rhythm with rapid rate but cannot identify name; controlled with metoprolol. Cardiac cath 4 years ago WNL, NST within last year WNL. Cardiologist in [facility], no need for f/u, denies SOB/chest pain. PCP Dr. Latanya Maudlin with [facility]   Frances Furbish, RN

## 2022-03-28 NOTE — H&P (Signed)
I have kidney stones.  HPI: Angel Kim is a 80 year-old female patient who was referred by Dr. Thomas L. Jones, MD who is here for renal calculi.  The problem is on both sides.   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 She has had no hematuria. She has had some deep intermittent right mid back pain but has significant DDD on CT and had been on meloxicam for the last 2 years but that has been stopped. She had left ESWL several years ago and then had ureteroscopy with manipulation of a fragment back into the kidney and then ESWL was done again with success. She passed 3 prior stones as well. She has no history of UTI's.      ALLERGIES: No Allergies    MEDICATIONS: Estrace 0.5 mg tablet  Metoprolol Succinate 50 mg tablet, extended release 24 hr  Amlodipine Besylate 5 mg tablet  Atorvastatin Calcium 40 mg tablet  Fluid Pill  Indapamide 1.25 mg tablet 1 tablet PO Daily     GU PSH: None   NON-GU PSH: Appendectomy Hysterectomy     GU PMH: None   NON-GU PMH: Arthritis Hypercholesterolemia Hypertension    FAMILY HISTORY: 2 sons - Other Breast Cancer - Sister Heart Disease - Runs in Family Hematuria - Runs in Family Hypertension - Runs in Family Kidney Failure - Runs in Family Kidney Stones - Runs in Family Myocardial Infarction - Mother   SOCIAL HISTORY: Marital Status: Widowed Preferred Language: English; Race: White Current Smoking Status: Patient does not smoke anymore. Has not smoked since 02/27/1972.   Tobacco Use Assessment Completed: Used Tobacco in last 30 days? Drinks 1 caffeinated drink per day.    REVIEW OF SYSTEMS:    GU Review Female:   Patient reports get up at night to urinate and stream starts and stops. Patient denies frequent urination, hard to  postpone urination, burning /pain with urination, leakage of urine, trouble starting your stream, have to strain to urinate, and being pregnant.  Gastrointestinal (Upper):   Patient denies nausea, vomiting, and indigestion/ heartburn.  Gastrointestinal (Lower):   Patient denies diarrhea and constipation.  Constitutional:   Patient reports fatigue. Patient denies fever, night sweats, and weight loss.  Skin:   Patient denies skin rash/ lesion and itching.  Eyes:   Patient denies blurred vision and double vision.  Ears/ Nose/ Throat:   Patient reports sinus problems. Patient denies sore throat.  Hematologic/Lymphatic:   Patient denies swollen glands and easy bruising.  Cardiovascular:   Patient reports leg swelling. Patient denies chest pains.  Respiratory:   Patient reports cough.   Endocrine:   Patient denies excessive thirst.  Musculoskeletal:   Patient reports joint pain. Patient denies back pain.  Neurological:   Patient denies dizziness and headaches.  Psychologic:   Patient denies depression and anxiety.   VITAL SIGNS:      03/12/2022 09:29 AM  Weight 136 lb / 61.69 kg  Height 65 in / 165.1 cm  BP 151/65 mmHg  Heart Rate 80 /min  Temperature 98.6 F / 37 C  BMI 22.6 kg/m   MULTI-SYSTEM PHYSICAL EXAMINATION:    Constitutional: Well-nourished. No physical deformities. Normally developed. Good grooming.  Neck: Neck symmetrical, not swollen. Normal   tracheal position.  Respiratory: Normal breath sounds. No labored breathing, no use of accessory muscles.   Cardiovascular: Regular rate and rhythm. No murmur, no gallop.   Skin: No paleness, no jaundice, no cyanosis. No lesion, no ulcer, no rash.  Neurologic / Psychiatric: Oriented to time, oriented to place, oriented to person. No depression, no anxiety, no agitation.  Gastrointestinal: No mass, no tenderness, no rigidity, non obese abdomen.  Musculoskeletal: Normal gait and station of head and neck.     PAST DATA REVIEW: None    PROCEDURES:         KUB - [id number]  A single view of the abdomen is obtained. the 7mm RUP stone and the 12mm left renal pelvic stone are readily visible. I don't clearly see the smaller right renal stones. She has lumbar DDD but no gas or soft tissue abnormalities.       Patient confirmed No Neulasta OnPro Device.           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 She isn't obstructed and has no pain but I think the renal pelvic stone needs treatment at a minimum and we discussed bilateral treatment.  I discussed URS vs ESWL and she would like to proceed with bilateral URS with lithotripsy and stents. I have reviewed the risks of ureteroscopy including bleeding, infection, ureteral injury, need for a stent or secondary procedures, thrombotic events and anesthetic complications.   I will get a hypercalciuria profile today. Her prior stone was calcium oxalate.    PLAN:           Orders Labs Hypercalciura Profile  X-Rays: KUB          Schedule Return Visit/Planned Activity: Next Available Appointment - Schedule Surgery  Procedure: Unspecified Date - Cysto Uretero Lithotripsy - 52353, bilateral Notes: Next avail    

## 2022-03-30 ENCOUNTER — Ambulatory Visit (HOSPITAL_COMMUNITY)
Admission: RE | Admit: 2022-03-30 | Discharge: 2022-03-30 | Disposition: A | Discharge status: Home / Self Care | Payer: Medicare Other | Attending: Urology | Admitting: Urology

## 2022-03-30 ENCOUNTER — Encounter (HOSPITAL_BASED_OUTPATIENT_CLINIC_OR_DEPARTMENT_OTHER)
Admission: RE | Disposition: A | Discharge status: Home / Self Care | Payer: Self-pay | Source: Home / Self Care | Attending: Urology

## 2022-03-30 ENCOUNTER — Ambulatory Visit (HOSPITAL_BASED_OUTPATIENT_CLINIC_OR_DEPARTMENT_OTHER): Payer: Medicare Other | Admitting: Anesthesiology

## 2022-03-30 ENCOUNTER — Encounter (HOSPITAL_BASED_OUTPATIENT_CLINIC_OR_DEPARTMENT_OTHER): Payer: Self-pay | Admitting: Urology

## 2022-03-30 DIAGNOSIS — N289 Disorder of kidney and ureter, unspecified: Secondary | ICD-10-CM | POA: Diagnosis not present

## 2022-03-30 DIAGNOSIS — M1990 Unspecified osteoarthritis, unspecified site: Secondary | ICD-10-CM

## 2022-03-30 DIAGNOSIS — Z87891 Personal history of nicotine dependence: Secondary | ICD-10-CM

## 2022-03-30 DIAGNOSIS — N202 Calculus of kidney with calculus of ureter: Secondary | ICD-10-CM

## 2022-03-30 DIAGNOSIS — N183 Chronic kidney disease, stage 3 unspecified: Secondary | ICD-10-CM | POA: Insufficient documentation

## 2022-03-30 DIAGNOSIS — K219 Gastro-esophageal reflux disease without esophagitis: Secondary | ICD-10-CM | POA: Diagnosis not present

## 2022-03-30 DIAGNOSIS — I10 Essential (primary) hypertension: Secondary | ICD-10-CM

## 2022-03-30 DIAGNOSIS — I129 Hypertensive chronic kidney disease with stage 1 through stage 4 chronic kidney disease, or unspecified chronic kidney disease: Secondary | ICD-10-CM | POA: Insufficient documentation

## 2022-03-30 DIAGNOSIS — N1832 Chronic kidney disease, stage 3b: Secondary | ICD-10-CM

## 2022-03-30 HISTORY — DX: Nausea with vomiting, unspecified: R11.2

## 2022-03-30 HISTORY — DX: Cardiac arrhythmia, unspecified: I49.9

## 2022-03-30 HISTORY — DX: Nausea with vomiting, unspecified: Z98.890

## 2022-03-30 HISTORY — DX: Personal history of other diseases of the digestive system: Z87.19

## 2022-03-30 HISTORY — DX: Personal history of urinary calculi: Z87.442

## 2022-03-30 HISTORY — DX: Chronic kidney disease, unspecified: N18.9

## 2022-03-30 HISTORY — DX: Gastro-esophageal reflux disease without esophagitis: K21.9

## 2022-03-30 HISTORY — PX: CYSTOSCOPY/URETEROSCOPY/HOLMIUM LASER/STENT PLACEMENT: SHX6546

## 2022-03-30 LAB — POCT I-STAT, CHEM 8
BUN: 23 mg/dL (ref 8–23)
Calcium, Ion: 1.15 mmol/L (ref 1.15–1.40)
Chloride: 102 mmol/L (ref 98–111)
Creatinine, Ser: 0.9 mg/dL (ref 0.44–1.00)
Glucose, Bld: 121 mg/dL — ABNORMAL HIGH (ref 70–99)
HCT: 43 % (ref 36.0–46.0)
Hemoglobin: 14.6 g/dL (ref 12.0–15.0)
Potassium: 4 mmol/L (ref 3.5–5.1)
Sodium: 137 mmol/L (ref 135–145)
TCO2: 23 mmol/L (ref 22–32)

## 2022-03-30 SURGERY — CYSTOSCOPY/URETEROSCOPY/HOLMIUM LASER/STENT PLACEMENT
Anesthesia: General | Site: Renal | Laterality: Bilateral

## 2022-03-30 MED ORDER — ONDANSETRON HCL 4 MG/2ML IJ SOLN
INTRAMUSCULAR | Status: AC
  Filled 2022-03-30: qty 2

## 2022-03-30 MED ORDER — GLYCOPYRROLATE PF 0.2 MG/ML IJ SOSY
PREFILLED_SYRINGE | INTRAMUSCULAR | Status: DC | PRN
  Administered 2022-03-30: .2 mg via INTRAVENOUS

## 2022-03-30 MED ORDER — SODIUM CHLORIDE 0.9 % IR SOLN
Status: DC | PRN
  Administered 2022-03-30: 3000 mL

## 2022-03-30 MED ORDER — AMISULPRIDE (ANTIEMETIC) 5 MG/2ML IV SOLN
INTRAVENOUS | Status: AC
  Filled 2022-03-30: qty 2

## 2022-03-30 MED ORDER — CEFAZOLIN SODIUM-DEXTROSE 2-4 GM/100ML-% IV SOLN
INTRAVENOUS | Status: AC
  Filled 2022-03-30: qty 100

## 2022-03-30 MED ORDER — HYDROMORPHONE HCL 1 MG/ML IJ SOLN: 0.2500 mg | INTRAMUSCULAR | Status: DC | PRN

## 2022-03-30 MED ORDER — AMISULPRIDE (ANTIEMETIC) 5 MG/2ML IV SOLN
10.0000 mg | Freq: Once | INTRAVENOUS | Status: AC
  Administered 2022-03-30: 10 mg via INTRAVENOUS

## 2022-03-30 MED ORDER — OXYBUTYNIN CHLORIDE 2.5 MG PO TABS: 1.0000 | ORAL_TABLET | Freq: Three times a day (TID) | ORAL | 0 refills | Status: DC | PRN

## 2022-03-30 MED ORDER — DEXAMETHASONE SODIUM PHOSPHATE 10 MG/ML IJ SOLN
INTRAMUSCULAR | Status: DC | PRN
  Administered 2022-03-30: 10 mg via INTRAVENOUS

## 2022-03-30 MED ORDER — FENTANYL CITRATE (PF) 100 MCG/2ML IJ SOLN
INTRAMUSCULAR | Status: DC | PRN
Start: 2022-03-30 — End: 2022-03-30
  Administered 2022-03-30 (×2): 25 ug via INTRAVENOUS
  Administered 2022-03-30: 50 ug via INTRAVENOUS

## 2022-03-30 MED ORDER — ACETAMINOPHEN 325 MG PO TABS: 650.0000 mg | ORAL_TABLET | ORAL | Status: DC | PRN

## 2022-03-30 MED ORDER — 0.9 % SODIUM CHLORIDE (POUR BTL) OPTIME
TOPICAL | Status: DC | PRN
  Administered 2022-03-30: 500 mL

## 2022-03-30 MED ORDER — LIDOCAINE 2% (20 MG/ML) 5 ML SYRINGE
INTRAMUSCULAR | Status: DC | PRN
  Administered 2022-03-30: 60 mg via INTRAVENOUS

## 2022-03-30 MED ORDER — PROPOFOL 10 MG/ML IV BOLUS
INTRAVENOUS | Status: DC | PRN
  Administered 2022-03-30: 150 mg via INTRAVENOUS

## 2022-03-30 MED ORDER — GLYCOPYRROLATE PF 0.2 MG/ML IJ SOSY
PREFILLED_SYRINGE | INTRAMUSCULAR | Status: AC
  Filled 2022-03-30: qty 1

## 2022-03-30 MED ORDER — PROPOFOL 10 MG/ML IV BOLUS
INTRAVENOUS | Status: AC
  Filled 2022-03-30: qty 20

## 2022-03-30 MED ORDER — SODIUM CHLORIDE 0.9 % IV SOLN: INTRAVENOUS | Status: DC

## 2022-03-30 MED ORDER — LIDOCAINE HCL (PF) 2 % IJ SOLN
INTRAMUSCULAR | Status: AC
  Filled 2022-03-30: qty 5

## 2022-03-30 MED ORDER — DEXAMETHASONE SODIUM PHOSPHATE 10 MG/ML IJ SOLN
INTRAMUSCULAR | Status: AC
  Filled 2022-03-30: qty 1

## 2022-03-30 MED ORDER — CEFAZOLIN SODIUM-DEXTROSE 2-4 GM/100ML-% IV SOLN
2.0000 g | INTRAVENOUS | Status: AC
  Administered 2022-03-30: 2 g via INTRAVENOUS

## 2022-03-30 MED ORDER — IOHEXOL 300 MG/ML  SOLN
INTRAMUSCULAR | Status: DC | PRN
  Administered 2022-03-30: 7.5 mL via URETHRAL

## 2022-03-30 MED ORDER — ONDANSETRON HCL 4 MG PO TABS: 4.0000 mg | ORAL_TABLET | Freq: Four times a day (QID) | ORAL | 1 refills | Status: DC | PRN

## 2022-03-30 MED ORDER — SODIUM CHLORIDE 0.9% FLUSH: 3.0000 mL | INTRAVENOUS | Status: DC | PRN

## 2022-03-30 MED ORDER — ONDANSETRON HCL 4 MG/2ML IJ SOLN
INTRAMUSCULAR | Status: DC | PRN
  Administered 2022-03-30: 4 mg via INTRAVENOUS

## 2022-03-30 MED ORDER — OXYCODONE HCL 5 MG PO TABS
5.0000 mg | ORAL_TABLET | ORAL | Status: DC | PRN
  Administered 2022-03-30: 5 mg via ORAL

## 2022-03-30 MED ORDER — OXYCODONE-ACETAMINOPHEN 5-325 MG PO TABS: 1.0000 | ORAL_TABLET | Freq: Four times a day (QID) | ORAL | 0 refills | Status: DC | PRN

## 2022-03-30 MED ORDER — FENTANYL CITRATE (PF) 100 MCG/2ML IJ SOLN
INTRAMUSCULAR | Status: AC
  Filled 2022-03-30: qty 2

## 2022-03-30 MED ORDER — OXYCODONE HCL 5 MG PO TABS
ORAL_TABLET | ORAL | Status: AC
  Filled 2022-03-30: qty 1

## 2022-03-30 MED ORDER — SODIUM CHLORIDE 0.9% FLUSH: 3.0000 mL | Freq: Two times a day (BID) | INTRAVENOUS | Status: DC

## 2022-03-30 MED ORDER — MORPHINE SULFATE (PF) 4 MG/ML IV SOLN: 2.0000 mg | INTRAVENOUS | Status: DC | PRN

## 2022-03-30 MED ORDER — ACETAMINOPHEN 325 MG RE SUPP: 650.0000 mg | RECTAL | Status: DC | PRN

## 2022-03-30 MED ORDER — SODIUM CHLORIDE 0.9 % IV SOLN: 250.0000 mL | INTRAVENOUS | Status: DC | PRN

## 2022-03-30 SURGICAL SUPPLY — 31 items
BAG DRAIN URO-CYSTO SKYTR STRL (DRAIN) ×1 IMPLANT
BAG DRN UROCATH (DRAIN) ×1
BASKET STONE NCOMPASS (UROLOGICAL SUPPLIES) IMPLANT
CATH URET 5FR 28IN OPEN ENDED (CATHETERS) ×1 IMPLANT
CATH URETERAL DUAL LUMEN 10F (MISCELLANEOUS) IMPLANT
CATH URETL OPEN 5X70 (CATHETERS) IMPLANT
CLOTH BEACON ORANGE TIMEOUT ST (SAFETY) ×2 IMPLANT
COVER DOME SNAP 22 D (MISCELLANEOUS) ×1 IMPLANT
EXTRACTOR STONE 1.7FRX115CM (UROLOGICAL SUPPLIES) ×1 IMPLANT
GLOVE BIOGEL PI IND STRL 7.0 (GLOVE) IMPLANT
GLOVE BIOGEL PI IND STRL 7.5 (GLOVE) IMPLANT
GLOVE BIOGEL PI INDICATOR 7.0 (GLOVE) ×1
GLOVE BIOGEL PI INDICATOR 7.5 (GLOVE) ×1
GLOVE SURG SS PI 8.0 STRL IVOR (GLOVE) ×2 IMPLANT
GOWN STRL REUS W/ TWL XL LVL3 (GOWN DISPOSABLE) ×1 IMPLANT
GOWN STRL REUS W/TWL LRG LVL3 (GOWN DISPOSABLE) ×1 IMPLANT
GOWN STRL REUS W/TWL XL LVL3 (GOWN DISPOSABLE) ×2
GUIDEWIRE STR DUAL SENSOR (WIRE) ×2 IMPLANT
IV NS IRRIG 3000ML ARTHROMATIC (IV SOLUTION) ×2 IMPLANT
KIT TURNOVER CYSTO (KITS) ×1 IMPLANT
MANIFOLD NEPTUNE II (INSTRUMENTS) ×2 IMPLANT
NS IRRIG 500ML POUR BTL (IV SOLUTION) ×1 IMPLANT
PACK CYSTO (CUSTOM PROCEDURE TRAY) ×2 IMPLANT
SHEATH NAVIGATOR HD 11/13X28 (SHEATH) ×1 IMPLANT
SHEATH NAVIGATOR HD 11/13X36 (SHEATH) IMPLANT
SOL PREP POV-IOD 4OZ 10% (MISCELLANEOUS) ×1 IMPLANT
STENT URET 6FRX24 CONTOUR (STENTS) ×2 IMPLANT
TRACTIP FLEXIVA PULS ID 200XHI (Laser) IMPLANT
TRACTIP FLEXIVA PULSE ID 200 (Laser) ×2
TUBE CONNECTING 12X1/4 (SUCTIONS) ×1 IMPLANT
TUBING UROLOGY SET (TUBING) ×2 IMPLANT

## 2022-03-30 NOTE — Anesthesia Procedure Notes (Signed)
Procedure Name: LMA Insertion Date/Time: 03/30/2022 11:02 AM Performed by: Bishop Limbo, CRNA Pre-anesthesia Checklist: Patient identified, Emergency Drugs available, Suction available and Patient being monitored Patient Re-evaluated:Patient Re-evaluated prior to induction Oxygen Delivery Method: Circle System Utilized Preoxygenation: Pre-oxygenation with 100% oxygen Induction Type: IV induction Ventilation: Mask ventilation without difficulty LMA: LMA inserted LMA Size: 4.0 Number of attempts: 1 Placement Confirmation: positive ETCO2 Tube secured with: Tape Dental Injury: Teeth and Oropharynx as per pre-operative assessment

## 2022-03-30 NOTE — Transfer of Care (Signed)
Immediate Anesthesia Transfer of Care Note  Patient: Angel Kim  Procedure(s) Performed: CYSTOSCOPY BILATERAL Alonza Smoker BILATERAL RETROGRADES Dorene Ar LASER/STENT PLACEMENT (Bilateral: Renal)  Patient Location: PACU  Anesthesia Type:General  Level of Consciousness: awake, alert , oriented and patient cooperative  Airway & Oxygen Therapy: Patient Spontanous Breathing  Post-op Assessment: Report given to RN and Post -op Vital signs reviewed and stable  Post vital signs: Reviewed and stable  Last Vitals:  Vitals Value Taken Time  BP 135/75 03/30/22 1233  Temp    Pulse 103 03/30/22 1237  Resp 16 03/30/22 1237  SpO2 93 % 03/30/22 1237  Vitals shown include unvalidated device data.  Last Pain:  Vitals:   03/30/22 0839  TempSrc: Oral  PainSc: 10-Worst pain ever      Patients Stated Pain Goal: 5 ([id number])  Complications: No notable events documented.

## 2022-03-30 NOTE — Discharge Instructions (Addendum)
You may use the tylenol #3 you have on hand but I sent oxycodone for more severe pain and I sent oxybutynin for bladder spasms which can occur with the stents.  The oxybutynin can cause dry mouth and constipation.  You should take a stool softener such as miralax 1capful with 8oz of fluid daily or colace 100mg  twice daily to avoid constipation.  Both are over the counter meds.            Post Anesthesia Home Care Instructions  Activity: Get plenty of rest for the remainder of the day. A responsible adult should stay with you for 24 hours following the procedure.  For the next 24 hours, DO NOT: -Drive a car -Paediatric nurse -Drink alcoholic beverages -Take any medication unless instructed by your physician -Make any legal decisions or sign important papers.  Meals: Start with liquid foods such as gelatin or soup. Progress to regular foods as tolerated. Avoid greasy, spicy, heavy foods. If nausea and/or vomiting occur, drink only clear liquids until the nausea and/or vomiting subsides. Call your physician if vomiting continues.  Special Instructions/Symptoms: Your throat may feel dry or sore from the anesthesia or the breathing tube placed in your throat during surgery. If this causes discomfort, gargle with warm salt water. The discomfort should disappear within 24 hours.

## 2022-03-30 NOTE — Progress Notes (Signed)
Next dose of oxycodone, 7:35 pm today if needed, written on instructions and instructed patient and family. Understanding verbalized. Dr. Annabell Howells notified that patient would like nausea medicine for home. He will send in prescription. Patient and family informed of this as well.

## 2022-03-30 NOTE — Anesthesia Postprocedure Evaluation (Signed)
Anesthesia Post Note  Patient: Angel Kim  Procedure(s) Performed: CYSTOSCOPY BILATERAL Hettie Holstein BILATERAL RETROGRADES Manning Charity LASER/STENT PLACEMENT (Bilateral: Renal)     Patient location during evaluation: PACU Anesthesia Type: General Level of consciousness: awake Pain management: pain level controlled Vital Signs Assessment: post-procedure vital signs reviewed and stable Respiratory status: spontaneous breathing Cardiovascular status: stable Postop Assessment: no apparent nausea or vomiting Anesthetic complications: no   No notable events documented.  Last Vitals:  Vitals:   03/30/22 0839 03/30/22 1240  BP: (!) 147/77   Pulse: 72 96  Resp: 15 11  Temp: 36.7 C 36.4 C  SpO2: 99% 93%    Last Pain:  Vitals:   03/30/22 0839  TempSrc: Oral  PainSc: 10-Worst pain ever                 [name]

## 2022-03-30 NOTE — Op Note (Signed)
Procedure: 1.  Cystoscopy with bilateral retrograde pyelography and interpretation. 2.  Bilateral ureteroscopy with holmium laser application, stone extraction and placement of double-J stents. 3.  Application of fluoroscopy.  Preop diagnosis: 1.  12 mm left renal pelvic stone with smaller renal stones. 2.  7 mm right upper pole stone with smaller calyceal stones.  Postop diagnosis: 1.  12 mm left proximal ureteral stone with small intrarenal stones. 2.  7 mm upper pole stone with small calyceal stones.  Surgeon: Dr. Bjorn Pippin.  Anesthesia: General.  Specimen: Stone fragments.  Drains: Bilateral 6 French by 24 cm contour double-J stent.  EBL: None.  Complications: None.  Indications: The patient is a 81 year old female who recently presented emergency room was found to have a 12 mm left renal pelvic stone with some pain.  She was also noted to have small calyceal stones bilaterally but also a 7 mm stone in the right upper pole.  After reviewing the options she is elected to undergo bilateral ureteroscopy to manage the symptomatic stone and the right renal stones.  Procedure: She was taken operating room where she was given Ancef.  A general anesthetic was induced.  She was placed in lithotomy position and fitted with PAS hose.  Her perineum and genitalia were prepped with Betadine solution and she was draped in usual sterile fashion.  Cystoscopy was performed using a 21 [place] scope and 30 degree lens.  Examination revealed a normal urethra.  The bladder wall was smooth and pale without tumors, stones or inflammation.  Ureteral orifices were unremarkable.  The left ureteral orifice was cannulated with a 5 [place] open-ended catheter and Omnipaque was instilled.  The left retrograde pyelogram developed demonstrated a normal caliber ureter up to the stone which had migrated from the pelvis into the UPJ/proximal ureteral area.  There was proximal dilation above the stone.  The right  ureteral orifice was cannulated with 5 [place] open-ended catheter and Omnipaque was instilled.  The right retrograde pyelogram demonstrated normal caliber ureter and nondilated intrarenal collecting system with a filling defect in the upper pole consistent with a known stone.  A sensor wire was then advanced to the left kidney under fluoroscopic guidance and the stone was pushed back into the renal pelvis.  The cystoscope was removed.  A 28 cm 11/13 French access sheath was selected and the 91 [place] inner core was passed over the wire without difficulty.  The assembled sheath was then inserted over the wire to the UPJ.  The inner core and wire were then removed.  The dual-lumen digital flexible ureteroscope was then passed through the sheath there was a little bit of angulation just below the UPJ so the sensor wire was advanced the ureteroscope to aid passage into the kidney.  Once in the kidney the stone was noted in the renal pelvis.  Further inspection revealed some small Randall's plaque stones in the mid and lower calyces.  A 200 tract tip holmium laser fiber was passed and the laser was initially set on 0.2 J and 53 Hz on the left pedal and 1 J and 20 Hz on the right pedal.  The renal pelvic stone was then fragmented with initial approaches in the low power setting.  I increased the power to 0.5 J but was still not satisfied with the rate of fragmentation so the 1 J and 20 Hz setting was then used and the stone was readily fragmented.  During the procedure the fragments moved into mid and upper  calyces and during inspection seeking the stone fragments I found and lasered the small stones adherent Randall plaques in the mid and lower calyces.  Several stone fragments were then removed with the engage basket.  The bulk of the stone however was reduced the dusting creatinine was too small to remove with the basket.  Once the stone was felt to be adequately treated by both fluoroscopic and endoscopic  assessment, the sensor wire was advanced the kidney through the scope and the scope and sheath were removed.  The cystoscope was reinserted over the wire and a 6 [place] by 24 cm contour double-J stent was advanced the kidney under fluoroscopic guidance.  The wire was removed, leaving a good coil in the kidney and a good coil in the bladder.  A sensor wire was then advanced to the right kidney under fluoroscopic guidance and the cystoscope was removed.  The inner core of the 11/13 [place] sheath was then passed without difficulty to the renal pelvis.  This was followed by the assembled sheath and the inner core and wire were then removed.  The digital ureteroscope was then easily passed into the collecting system and several small stones adherent to Randall's plaques were lasered and released from the papillary tips.  The bulk of the fragments from the stones were too small to retrieve.  I then identified the 7 mm right upper pole stone and fragmented it using the 1 J and 20 Hz setting.  The stone appeared to be composed of calcium oxalate monohydrate and broke into manageable fragments without much sand or grit.  The fragments were then removed with the engage basket and final inspection revealed no significant residual from fragments however there was a little bit of sand and grit remaining in the kidney.  Fluoroscopy revealed no additional stones so the wire was replaced through the ureteroscope to the kidney and the ureteroscope and sheath were removed.    The cystoscope was then replaced over the wire and a 6 [place] by 24 cm contour double-J stent was passed to the right kidney under fluoroscopic guidance.  The wire was removed, leaving a good coil in the kidney and a good coil in the bladder.  The bladder was drained and the cystoscope was removed.  She was taken down from lithotomy position, her anesthetic was reversed and she was moved to recovery in stable condition.  There were no complications.  Stone  fragments were given to her family.

## 2022-03-30 NOTE — H&P (Signed)
I have kidney stones.  HPI: Angel Kim is a 81 year-old female patient who was referred by Dr. Thomas L. Jones, MD who is here for renal calculi.  The problem is on both sides.   Angel Kim is a 81 yo female who is sent for kidney stones who was found to have CKD3 on recent labs some a CT stone study was done and she had a 12mm stone in the left renal pelvis and several stones in the right kidney with the largest 7mm in the RUP. She has had no hematuria. She has had some deep intermittent right mid back pain but has significant DDD on CT and had been on meloxicam for the last 2 years but that has been stopped. She had left ESWL several years ago and then had ureteroscopy with manipulation of a fragment back into the kidney and then ESWL was done again with success. She passed 3 prior stones as well. She has no history of UTI's.      ALLERGIES: No Allergies    MEDICATIONS: Estrace 0.5 mg tablet  Metoprolol Succinate 50 mg tablet, extended release 24 hr  Amlodipine Besylate 5 mg tablet  Atorvastatin Calcium 40 mg tablet  Fluid Pill  Indapamide 1.25 mg tablet 1 tablet PO Daily     GU PSH: None   NON-GU PSH: Appendectomy Hysterectomy     GU PMH: None   NON-GU PMH: Arthritis Hypercholesterolemia Hypertension    FAMILY HISTORY: 2 sons - Other Breast Cancer - Sister Heart Disease - Runs in Family Hematuria - Runs in Family Hypertension - Runs in Family Kidney Failure - Runs in Family Kidney Stones - Runs in Family Myocardial Infarction - Mother   SOCIAL HISTORY: Marital Status: Widowed Preferred Language: English; Race: White Current Smoking Status: Patient does not smoke anymore. Has not smoked since 02/27/1972.   Tobacco Use Assessment Completed: Used Tobacco in last 30 days? Drinks 1 caffeinated drink per day.    REVIEW OF SYSTEMS:    GU Review Female:   Patient reports get up at night to urinate and stream starts and stops. Patient denies frequent urination, hard to  postpone urination, burning /pain with urination, leakage of urine, trouble starting your stream, have to strain to urinate, and being pregnant.  Gastrointestinal (Upper):   Patient denies nausea, vomiting, and indigestion/ heartburn.  Gastrointestinal (Lower):   Patient denies diarrhea and constipation.  Constitutional:   Patient reports fatigue. Patient denies fever, night sweats, and weight loss.  Skin:   Patient denies skin rash/ lesion and itching.  Eyes:   Patient denies blurred vision and double vision.  Ears/ Nose/ Throat:   Patient reports sinus problems. Patient denies sore throat.  Hematologic/Lymphatic:   Patient denies swollen glands and easy bruising.  Cardiovascular:   Patient reports leg swelling. Patient denies chest pains.  Respiratory:   Patient reports cough.   Endocrine:   Patient denies excessive thirst.  Musculoskeletal:   Patient reports joint pain. Patient denies back pain.  Neurological:   Patient denies dizziness and headaches.  Psychologic:   Patient denies depression and anxiety.   VITAL SIGNS:      03/12/2022 09:29 AM  Weight 136 lb / 61.69 kg  Height 65 in / 165.1 cm  BP 151/65 mmHg  Heart Rate 80 /min  Temperature 98.6 F / 37 C  BMI 22.6 kg/m   MULTI-SYSTEM PHYSICAL EXAMINATION:    Constitutional: Well-nourished. No physical deformities. Normally developed. Good grooming.  Neck: Neck symmetrical, not swollen. Normal   tracheal position.  Respiratory: Normal breath sounds. No labored breathing, no use of accessory muscles.   Cardiovascular: Regular rate and rhythm. No murmur, no gallop.   Skin: No paleness, no jaundice, no cyanosis. No lesion, no ulcer, no rash.  Neurologic / Psychiatric: Oriented to time, oriented to place, oriented to person. No depression, no anxiety, no agitation.  Gastrointestinal: No mass, no tenderness, no rigidity, non obese abdomen.  Musculoskeletal: Normal gait and station of head and neck.     PAST DATA REVIEW: None    PROCEDURES:         KUB - [id number]  A single view of the abdomen is obtained. the 7mm RUP stone and the 12mm left renal pelvic stone are readily visible. I don't clearly see the smaller right renal stones. She has lumbar DDD but no gas or soft tissue abnormalities.       Patient confirmed No Neulasta OnPro Device.           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 She isn't obstructed and has no pain but I think the renal pelvic stone needs treatment at a minimum and we discussed bilateral treatment.  I discussed URS vs ESWL and she would like to proceed with bilateral URS with lithotripsy and stents. I have reviewed the risks of ureteroscopy including bleeding, infection, ureteral injury, need for a stent or secondary procedures, thrombotic events and anesthetic complications.   I will get a hypercalciuria profile today. Her prior stone was calcium oxalate.    PLAN:           Orders Labs Hypercalciura Profile  X-Rays: KUB          Schedule Return Visit/Planned Activity: Next Available Appointment - Schedule Surgery  Procedure: Unspecified Date - Cysto Uretero Lithotripsy - 52353, bilateral Notes: Next avail    

## 2022-03-30 NOTE — Anesthesia Preprocedure Evaluation (Addendum)
Anesthesia Evaluation  Patient identified by MRN, date of birth, ID band  History of Anesthesia Complications (+) PONV and history of anesthetic complications  Airway Mallampati: II  TM Distance: >3 FB     Dental   Pulmonary former smoker,    breath sounds clear to auscultation       Cardiovascular hypertension,  Rhythm:Regular Rate:Normal     Neuro/Psych    GI/Hepatic hiatal hernia, GERD  ,  Endo/Other    Renal/GU Renal disease     Musculoskeletal  (+) Arthritis ,   Abdominal   Peds  Hematology   Anesthesia Other Findings   Reproductive/Obstetrics                             Anesthesia Physical Anesthesia Plan  ASA: 3  Anesthesia Plan: General   Post-op Pain Management:    Induction: Intravenous  PONV Risk Score and Plan: Ondansetron, Dexamethasone and Midazolam  Airway Management Planned: LMA  Additional Equipment:   Intra-op Plan:   Post-operative Plan: Extubation in OR  Informed Consent: I have reviewed the patients History and Physical, chart, labs and discussed the procedure including the risks, benefits and alternatives for the proposed anesthesia with the patient or authorized representative who has indicated his/her understanding and acceptance.     Dental advisory given  Plan Discussed with: CRNA and Anesthesiologist  Anesthesia Plan Comments:        Anesthesia Quick Evaluation

## 2022-03-30 NOTE — Interval H&P Note (Signed)
History and Physical Interval Note:  Angel Kim reports increased pain over the last 2 days on the left.   03/30/2022 10:41 AM  Angel Kim  has presented today for surgery, with the diagnosis of BILATERAL RENAL STONES.  The various methods of treatment have been discussed with the patient and family. After consideration of risks, benefits and other options for treatment, the patient has consented to  Procedure(s): CYSTOSCOPY BILATERAL /URETEROSCOPY BILATERAL RETROGRADES /HOLMIUM LASER/STENT PLACEMENT (Bilateral) as a surgical intervention.  The patient's history has been reviewed, patient examined, no change in status, stable for surgery.  I have reviewed the patient's chart and labs.  Questions were answered to the patient's satisfaction.     Bjorn Pippin

## 2022-04-02 ENCOUNTER — Encounter (HOSPITAL_BASED_OUTPATIENT_CLINIC_OR_DEPARTMENT_OTHER): Payer: Self-pay | Admitting: Urology

## 2022-06-06 ENCOUNTER — Other Ambulatory Visit: Payer: Self-pay | Admitting: Internal Medicine

## 2022-06-06 ENCOUNTER — Encounter: Payer: Self-pay | Admitting: Internal Medicine

## 2022-06-06 DIAGNOSIS — I10 Essential (primary) hypertension: Secondary | ICD-10-CM

## 2022-06-06 MED ORDER — INDAPAMIDE 1.25 MG PO TABS: 1.2500 mg | ORAL_TABLET | Freq: Every day | ORAL | 0 refills | Status: DC

## 2022-06-06 MED ORDER — AMLODIPINE BESYLATE 5 MG PO TABS: 5.0000 mg | ORAL_TABLET | Freq: Every day | ORAL | 0 refills | Status: DC

## 2022-06-07 ENCOUNTER — Other Ambulatory Visit: Payer: Self-pay | Admitting: Internal Medicine

## 2022-06-16 ENCOUNTER — Other Ambulatory Visit: Payer: Self-pay | Admitting: Internal Medicine

## 2022-06-16 DIAGNOSIS — N952 Postmenopausal atrophic vaginitis: Secondary | ICD-10-CM

## 2022-06-16 DIAGNOSIS — I10 Essential (primary) hypertension: Secondary | ICD-10-CM

## 2022-07-19 ENCOUNTER — Other Ambulatory Visit: Payer: Self-pay | Admitting: Internal Medicine

## 2022-07-19 DIAGNOSIS — I10 Essential (primary) hypertension: Secondary | ICD-10-CM

## 2022-07-19 MED ORDER — METOPROLOL SUCCINATE 50 MG PO CS24: 1.0000 | EXTENDED_RELEASE_CAPSULE | Freq: Two times a day (BID) | ORAL | 0 refills | Status: DC

## 2022-07-27 ENCOUNTER — Other Ambulatory Visit: Payer: Self-pay | Admitting: Internal Medicine

## 2022-07-27 ENCOUNTER — Encounter: Payer: Self-pay | Admitting: Internal Medicine

## 2022-07-27 DIAGNOSIS — R000 Tachycardia, unspecified: Secondary | ICD-10-CM

## 2022-07-27 DIAGNOSIS — I10 Essential (primary) hypertension: Secondary | ICD-10-CM

## 2022-07-27 MED ORDER — METOPROLOL TARTRATE 50 MG PO TABS: 50.0000 mg | ORAL_TABLET | Freq: Two times a day (BID) | ORAL | 0 refills | Status: DC

## 2022-08-15 ENCOUNTER — Other Ambulatory Visit: Payer: Self-pay | Admitting: Internal Medicine

## 2022-08-15 DIAGNOSIS — E785 Hyperlipidemia, unspecified: Secondary | ICD-10-CM

## 2022-08-20 ENCOUNTER — Ambulatory Visit: Payer: Medicare Other | Admitting: Internal Medicine

## 2022-08-27 ENCOUNTER — Ambulatory Visit (INDEPENDENT_AMBULATORY_CARE_PROVIDER_SITE_OTHER): Payer: Medicare Other | Admitting: Internal Medicine

## 2022-08-27 ENCOUNTER — Encounter: Payer: Self-pay | Admitting: Internal Medicine

## 2022-08-27 VITALS — BP 156/76 | HR 62 | Temp 97.7°F | Resp 16 | Ht 65.0 in | Wt 135.0 lb

## 2022-08-27 DIAGNOSIS — J0101 Acute recurrent maxillary sinusitis: Secondary | ICD-10-CM

## 2022-08-27 DIAGNOSIS — Z23 Encounter for immunization: Secondary | ICD-10-CM | POA: Diagnosis not present

## 2022-08-27 DIAGNOSIS — I10 Essential (primary) hypertension: Secondary | ICD-10-CM

## 2022-08-27 DIAGNOSIS — R739 Hyperglycemia, unspecified: Secondary | ICD-10-CM

## 2022-08-27 DIAGNOSIS — N1832 Chronic kidney disease, stage 3b: Secondary | ICD-10-CM

## 2022-08-27 LAB — BASIC METABOLIC PANEL
BUN: 23 mg/dL (ref 6–23)
CO2: 28 mEq/L (ref 19–32)
Calcium: 10.5 mg/dL (ref 8.4–10.5)
Chloride: 104 mEq/L (ref 96–112)
Creatinine, Ser: 0.78 mg/dL (ref 0.40–1.20)
GFR: 72.23 mL/min (ref >60.00)
Glucose, Bld: 102 mg/dL — ABNORMAL HIGH (ref 70–99)
Potassium: 3.6 mEq/L (ref 3.5–5.1)
Sodium: 141 mEq/L (ref 135–145)

## 2022-08-27 LAB — CBC WITH DIFFERENTIAL/PLATELET
Basophils Absolute: 0.1 10*3/uL (ref 0.0–0.1)
Basophils Relative: 1 % (ref 0.0–3.0)
Eosinophils Absolute: 0.3 10*3/uL (ref 0.0–0.7)
Eosinophils Relative: 3.3 % (ref 0.0–5.0)
HCT: 40.6 % (ref 36.0–46.0)
Hemoglobin: 13.8 g/dL (ref 12.0–15.0)
Lymphocytes Relative: 28.6 % (ref 12.0–46.0)
Lymphs Abs: 2.6 10*3/uL (ref 0.7–4.0)
MCHC: 33.9 g/dL (ref 30.0–36.0)
MCV: 86.7 fl (ref 78.0–100.0)
Monocytes Absolute: 0.6 10*3/uL (ref 0.1–1.0)
Monocytes Relative: 6.9 % (ref 3.0–12.0)
Neutro Abs: 5.5 10*3/uL (ref 1.4–7.7)
Neutrophils Relative %: 60.2 % (ref 43.0–77.0)
Platelets: 257 10*3/uL (ref 150.0–400.0)
RBC: 4.69 Mil/uL (ref 3.87–5.11)
RDW: 13.5 % (ref 11.5–15.5)
WBC: 9.1 10*3/uL (ref 4.0–10.5)

## 2022-08-27 LAB — URINALYSIS, ROUTINE W REFLEX MICROSCOPIC
Bilirubin Urine: NEGATIVE
Hgb urine dipstick: NEGATIVE
Ketones, ur: NEGATIVE
Leukocytes,Ua: NEGATIVE
Nitrite: NEGATIVE
Specific Gravity, Urine: 1.025 (ref 1.000–1.030)
Total Protein, Urine: NEGATIVE
Urine Glucose: NEGATIVE
Urobilinogen, UA: 0.2 (ref 0.0–1.0)
pH: 6 (ref 5.0–8.0)

## 2022-08-27 LAB — HEMOGLOBIN A1C: Hgb A1c MFr Bld: 5.9 % (ref 4.6–6.5)

## 2022-08-27 MED ORDER — AMOXICILLIN-POT CLAVULANATE 875-125 MG PO TABS: 1.0000 | ORAL_TABLET | Freq: Two times a day (BID) | ORAL | 0 refills | Status: AC

## 2022-08-27 NOTE — Progress Notes (Unsigned)
Subjective:  Patient ID: Angel Kim, female    DOB: Jan 24, 1941  Age: 81 y.o. MRN: [id number]  CC: Hypertension and Sinusitis   HPI Angel Kim presents for f/up -  She complains of a several week history of thick yellow nasal phlegm with postnasal drip and facial pain.  She denies epistaxis, sore throat, fever, chills, or lymphadenopathy.  She has gotten some symptom relief with Flonase and Afrin nasal spray.  Outpatient Medications Prior to Visit  Medication Sig Dispense Refill   amLODipine (NORVASC) 5 MG tablet Take 1 tablet (5 mg total) by mouth daily. 90 tablet 0   aspirin EC 81 MG tablet Take 81 mg by mouth daily. Swallow whole.     atorvastatin (LIPITOR) 40 MG tablet TAKE 1 TABLET BY MOUTH EVERY DAY 90 tablet 0   estradiol (ESTRACE) 0.5 MG tablet TAKE 1 TABLET BY MOUTH EVERY DAY 90 tablet 0   Fexofenadine HCl (ALLEGRA ALLERGY PO) Take by mouth.     indapamide (LOZOL) 1.25 MG tablet Take 1 tablet (1.25 mg total) by mouth daily. 90 tablet 0   metoprolol tartrate (LOPRESSOR) 50 MG tablet Take 1 tablet (50 mg total) by mouth 2 (two) times daily. 180 tablet 0   Multiple Vitamin (MULTIVITAMIN ADULT PO) Take 1 capsule by mouth daily.     NIACIN PO Take 1 capsule by mouth at bedtime.     olmesartan (BENICAR) 20 MG tablet TAKE 1 TABLET BY MOUTH EVERY DAY 90 tablet 0   Oxybutynin Chloride 2.5 MG TABS Take 1 tablet by mouth 3 (three) times daily as needed (bladder spasm). 15 tablet 0   acetaminophen-codeine (TYLENOL #3) 300-30 MG tablet Take by mouth every 6 (six) hours as needed for moderate pain.     ondansetron (ZOFRAN) 4 MG tablet Take 1 tablet (4 mg total) by mouth 4 (four) times daily as needed for nausea or vomiting. 15 tablet 1   oxyCODONE-acetaminophen (PERCOCET) 5-325 MG tablet Take 1 tablet by mouth every 6 (six) hours as needed for severe pain. 15 tablet 0   No facility-administered medications prior to visit.    ROS Review of Systems  Constitutional: Negative.  Negative  for chills, diaphoresis, fatigue and fever.  HENT:  Positive for postnasal drip, rhinorrhea, sinus pressure and sinus pain. Negative for congestion, sore throat and voice change.   Eyes: Negative.   Respiratory: Negative.  Negative for cough, chest tightness, shortness of breath and wheezing.   Cardiovascular:  Negative for chest pain, palpitations and leg swelling.  Gastrointestinal:  Negative for abdominal pain, diarrhea, nausea and vomiting.  Endocrine: Negative.   Genitourinary: Negative.   Musculoskeletal: Negative.   Skin: Negative.   Allergic/Immunologic: Negative.   Neurological: Negative.  Negative for dizziness and weakness.    Objective:  BP (!) 156/76 (BP Location: Left Arm, Patient Position: Sitting, Cuff Size: Large)   Pulse 62   Temp 97.7 F (36.5 C) (Oral)   Resp 16   Ht 5\' 5"  (1.651 m)   Wt 135 lb (61.2 kg)   SpO2 95%   BMI 22.47 kg/m   BP Readings from Last 3 Encounters:  08/27/22 (!) 156/76  03/30/22 (!) 151/75  02/15/22 (!) 142/82    Wt Readings from Last 3 Encounters:  08/27/22 135 lb (61.2 kg)  03/30/22 133 lb 6.4 oz (60.5 kg)  02/15/22 136 lb (61.7 kg)    Physical Exam Vitals reviewed.  HENT:     Nose: Nose normal.     Mouth/Throat:  Mouth: Mucous membranes are moist.  Eyes:     Conjunctiva/sclera: Conjunctivae normal.  Cardiovascular:     Rate and Rhythm: Normal rate and regular rhythm.     Heart sounds: No murmur heard. Pulmonary:     Effort: Pulmonary effort is normal.     Breath sounds: No stridor. No wheezing, rhonchi or rales.  Abdominal:     General: Abdomen is flat.     Palpations: There is no mass.     Tenderness: There is no abdominal tenderness. There is no guarding.     Hernia: No hernia is present.  Musculoskeletal:        General: Normal range of motion.     Cervical back: Neck supple.     Right lower leg: No edema.     Left lower leg: No edema.  Lymphadenopathy:     Cervical: No cervical adenopathy.  Skin:     General: Skin is warm and dry.  Neurological:     General: No focal deficit present.     Mental Status: She is alert.  Psychiatric:        Mood and Affect: Mood normal.        Behavior: Behavior normal.     Lab Results  Component Value Date   WBC 9.1 08/27/2022   HGB 13.8 08/27/2022   HCT 40.6 08/27/2022   PLT 257.0 08/27/2022   GLUCOSE 102 (H) 08/27/2022   CHOL 179 02/15/2022   TRIG 89.0 02/15/2022   HDL 67.30 02/15/2022   LDLCALC 94 02/15/2022   ALT 25 02/15/2022   AST 22 02/15/2022   NA 141 08/27/2022   K 3.6 08/27/2022   CL 104 08/27/2022   CREATININE 0.78 08/27/2022   BUN 23 08/27/2022   CO2 28 08/27/2022   TSH 2.15 02/15/2022   HGBA1C 5.9 08/27/2022    No results found.  Assessment & Plan:   Angel Kim was seen today for hypertension and sinusitis.  Diagnoses and all orders for this visit:  Chronic hyperglycemia- Her A1c is normal -     Basic metabolic panel; Future -     Hemoglobin A1c; Future -     Hemoglobin A1c -     Basic metabolic panel  Primary hypertension- Her blood pressure is adequately well controlled. -     Basic metabolic panel; Future -     CBC with Differential/Platelet; Future -     Urinalysis, Routine w reflex microscopic; Future -     Urinalysis, Routine w reflex microscopic -     CBC with Differential/Platelet -     Basic metabolic panel  Stage 3b chronic kidney disease (HCC)- Will avoid nephrotoxic agents. -     Basic metabolic panel; Future -     Urinalysis, Routine w reflex microscopic; Future -     Urinalysis, Routine w reflex microscopic -     Basic metabolic panel  Acute recurrent maxillary sinusitis -     amoxicillin-clavulanate (AUGMENTIN) 875-125 MG tablet; Take 1 tablet by mouth 2 (two) times daily for 10 days.  Flu vaccine need -     Flu Vaccine QUAD High Dose(Fluad)  Need for vaccination -     Pneumococcal conjugate vaccine 20-valent   I have discontinued Angel Kim's acetaminophen-codeine,  oxyCODONE-acetaminophen, and ondansetron. I am also having her start on amoxicillin-clavulanate. Additionally, I am having her maintain her Fexofenadine HCl (ALLEGRA ALLERGY PO), Multiple Vitamin (MULTIVITAMIN ADULT PO), NIACIN PO, aspirin EC, oxyBUTYnin Chloride, indapamide, amLODipine, estradiol, olmesartan, metoprolol tartrate, and atorvastatin.  Meds ordered this encounter  Medications   amoxicillin-clavulanate (AUGMENTIN) 875-125 MG tablet    Sig: Take 1 tablet by mouth 2 (two) times daily for 10 days.    Dispense:  20 tablet    Refill:  0     Follow-up: Return in about 3 months (around 11/27/2022).  Scarlette Calico, MD

## 2022-08-27 NOTE — Patient Instructions (Signed)
Hypertension, Adult High blood pressure (hypertension) is when the force of blood pumping through the arteries is too strong. The arteries are the blood vessels that carry blood from the heart throughout the body. Hypertension forces the heart to work harder to pump blood and may cause arteries to become narrow or stiff. Untreated or uncontrolled hypertension can lead to a heart attack, heart failure, a stroke, kidney disease, and other problems. A blood pressure reading consists of a higher number over a lower number. Ideally, your blood pressure should be below 120/80. The first ("top") number is called the systolic pressure. It is a measure of the pressure in your arteries as your heart beats. The second ("bottom") number is called the diastolic pressure. It is a measure of the pressure in your arteries as the heart relaxes. What are the causes? The exact cause of this condition is not known. There are some conditions that result in high blood pressure. What increases the risk? Certain factors may make you more likely to develop high blood pressure. Some of these risk factors are under your control, including: Smoking. Not getting enough exercise or physical activity. Being overweight. Having too much fat, sugar, calories, or salt (sodium) in your diet. Drinking too much alcohol. Other risk factors include: Having a personal history of heart disease, diabetes, high cholesterol, or kidney disease. Stress. Having a family history of high blood pressure and high cholesterol. Having obstructive sleep apnea. Age. The risk increases with age. What are the signs or symptoms? High blood pressure may not cause symptoms. Very high blood pressure (hypertensive crisis) may cause: Headache. Fast or irregular heartbeats (palpitations). Shortness of breath. Nosebleed. Nausea and vomiting. Vision changes. Severe chest pain, dizziness, and seizures. How is this diagnosed? This condition is diagnosed by  measuring your blood pressure while you are seated, with your arm resting on a flat surface, your legs uncrossed, and your feet flat on the floor. The cuff of the blood pressure monitor will be placed directly against the skin of your upper arm at the level of your heart. Blood pressure should be measured at least twice using the same arm. Certain conditions can cause a difference in blood pressure between your right and left arms. If you have a high blood pressure reading during one visit or you have normal blood pressure with other risk factors, you may be asked to: Return on a different day to have your blood pressure checked again. Monitor your blood pressure at home for 1 week or longer. If you are diagnosed with hypertension, you may have other blood or imaging tests to help your health care provider understand your overall risk for other conditions. How is this treated? This condition is treated by making healthy lifestyle changes, such as eating healthy foods, exercising more, and reducing your alcohol intake. You may be referred for counseling on a healthy diet and physical activity. Your health care provider may prescribe medicine if lifestyle changes are not enough to get your blood pressure under control and if: Your systolic blood pressure is above 130. Your diastolic blood pressure is above 80. Your personal target blood pressure may vary depending on your medical conditions, your age, and other factors. Follow these instructions at home: Eating and drinking  Eat a diet that is high in fiber and potassium, and low in sodium, added sugar, and fat. An example of this eating plan is called the DASH diet. DASH stands for Dietary Approaches to Stop Hypertension. To eat this way: Eat   plenty of fresh fruits and vegetables. Try to fill one half of your plate at each meal with fruits and vegetables. Eat whole grains, such as whole-wheat pasta, brown rice, or whole-grain bread. Fill about one  fourth of your plate with whole grains. Eat or drink low-fat dairy products, such as skim milk or low-fat yogurt. Avoid fatty cuts of meat, processed or cured meats, and poultry with skin. Fill about one fourth of your plate with lean proteins, such as fish, chicken without skin, beans, eggs, or tofu. Avoid pre-made and processed foods. These tend to be higher in sodium, added sugar, and fat. Reduce your daily sodium intake. Many people with hypertension should eat less than 1,500 mg of sodium a day. Do not drink alcohol if: Your health care provider tells you not to drink. You are pregnant, may be pregnant, or are planning to become pregnant. If you drink alcohol: Limit how much you have to: 0-1 drink a day for women. 0-2 drinks a day for men. Know how much alcohol is in your drink. In the [place] one drink equals one 12 oz bottle of beer (355 mL), one 5 oz glass of wine (148 mL), or one 1 oz glass of hard liquor (44 mL). Lifestyle  Work with your health care provider to maintain a healthy body weight or to lose weight. Ask what an ideal weight is for you. Get at least 30 minutes of exercise that causes your heart to beat faster (aerobic exercise) most days of the week. Activities may include walking, swimming, or biking. Include exercise to strengthen your muscles (resistance exercise), such as Pilates or lifting weights, as part of your weekly exercise routine. Try to do these types of exercises for 30 minutes at least 3 days a week. Do not use any products that contain nicotine or tobacco. These products include cigarettes, chewing tobacco, and vaping devices, such as e-cigarettes. If you need help quitting, ask your health care provider. Monitor your blood pressure at home as told by your health care provider. Keep all follow-up visits. This is important. Medicines Take over-the-counter and prescription medicines only as told by your health care provider. Follow directions carefully. Blood  pressure medicines must be taken as prescribed. Do not skip doses of blood pressure medicine. Doing this puts you at risk for problems and can make the medicine less effective. Ask your health care provider about side effects or reactions to medicines that you should watch for. Contact a health care provider if you: Think you are having a reaction to a medicine you are taking. Have headaches that keep coming back (recurring). Feel dizzy. Have swelling in your ankles. Have trouble with your vision. Get help right away if you: Develop a severe headache or confusion. Have unusual weakness or numbness. Feel faint. Have severe pain in your chest or abdomen. Vomit repeatedly. Have trouble breathing. These symptoms may be an emergency. Get help right away. Call 911. Do not wait to see if the symptoms will go away. Do not drive yourself to the hospital. Summary Hypertension is when the force of blood pumping through your arteries is too strong. If this condition is not controlled, it may put you at risk for serious complications. Your personal target blood pressure may vary depending on your medical conditions, your age, and other factors. For most people, a normal blood pressure is less than 120/80. Hypertension is treated with lifestyle changes, medicines, or a combination of both. Lifestyle changes include losing weight, eating a healthy,   low-sodium diet, exercising more, and limiting alcohol. This information is not intended to replace advice given to you by your health care provider. Make sure you discuss any questions you have with your health care provider. Document Revised: 08/22/2021 Document Reviewed: 08/22/2021 Elsevier Patient Education  2023 [employer]  

## 2022-08-30 ENCOUNTER — Other Ambulatory Visit: Payer: Self-pay | Admitting: Internal Medicine

## 2022-08-30 DIAGNOSIS — N952 Postmenopausal atrophic vaginitis: Secondary | ICD-10-CM

## 2022-08-30 DIAGNOSIS — I10 Essential (primary) hypertension: Secondary | ICD-10-CM

## 2022-09-13 ENCOUNTER — Other Ambulatory Visit: Payer: Self-pay | Admitting: Internal Medicine

## 2022-09-13 DIAGNOSIS — I10 Essential (primary) hypertension: Secondary | ICD-10-CM

## 2022-09-13 MED ORDER — OLMESARTAN MEDOXOMIL 20 MG PO TABS: 20.0000 mg | ORAL_TABLET | Freq: Every day | ORAL | 0 refills | Status: DC

## 2022-10-16 ENCOUNTER — Other Ambulatory Visit: Payer: Self-pay | Admitting: Internal Medicine

## 2022-10-16 DIAGNOSIS — R000 Tachycardia, unspecified: Secondary | ICD-10-CM

## 2022-10-16 DIAGNOSIS — I10 Essential (primary) hypertension: Secondary | ICD-10-CM

## 2022-10-16 MED ORDER — METOPROLOL TARTRATE 50 MG PO TABS: 50.0000 mg | ORAL_TABLET | Freq: Two times a day (BID) | ORAL | 0 refills | Status: DC

## 2022-11-02 ENCOUNTER — Other Ambulatory Visit: Payer: Self-pay | Admitting: Internal Medicine

## 2022-11-02 DIAGNOSIS — I10 Essential (primary) hypertension: Secondary | ICD-10-CM

## 2022-11-02 DIAGNOSIS — N952 Postmenopausal atrophic vaginitis: Secondary | ICD-10-CM

## 2022-11-02 MED ORDER — AMLODIPINE BESYLATE 5 MG PO TABS: 5.0000 mg | ORAL_TABLET | Freq: Every day | ORAL | 0 refills | Status: DC

## 2022-11-02 MED ORDER — ESTRADIOL 0.5 MG PO TABS: 0.5000 mg | ORAL_TABLET | Freq: Every day | ORAL | 0 refills | Status: DC

## 2022-11-09 ENCOUNTER — Other Ambulatory Visit: Payer: Self-pay | Admitting: Internal Medicine

## 2022-11-09 DIAGNOSIS — E785 Hyperlipidemia, unspecified: Secondary | ICD-10-CM

## 2022-11-14 ENCOUNTER — Encounter: Payer: Self-pay | Admitting: Internal Medicine

## 2022-11-27 ENCOUNTER — Encounter: Payer: Self-pay | Admitting: Internal Medicine

## 2022-11-27 ENCOUNTER — Ambulatory Visit (INDEPENDENT_AMBULATORY_CARE_PROVIDER_SITE_OTHER): Payer: Medicare Other | Admitting: Internal Medicine

## 2022-11-27 VITALS — BP 142/74 | HR 64 | Temp 97.8°F | Resp 16 | Ht 65.0 in | Wt 139.0 lb

## 2022-11-27 DIAGNOSIS — N1832 Chronic kidney disease, stage 3b: Secondary | ICD-10-CM

## 2022-11-27 DIAGNOSIS — Z1231 Encounter for screening mammogram for malignant neoplasm of breast: Secondary | ICD-10-CM

## 2022-11-27 DIAGNOSIS — I10 Essential (primary) hypertension: Secondary | ICD-10-CM | POA: Diagnosis not present

## 2022-11-27 LAB — BASIC METABOLIC PANEL
BUN: 25 mg/dL — ABNORMAL HIGH (ref 6–23)
CO2: 28 mEq/L (ref 19–32)
Calcium: 10.2 mg/dL (ref 8.4–10.5)
Chloride: 102 mEq/L (ref 96–112)
Creatinine, Ser: 0.79 mg/dL (ref 0.40–1.20)
GFR: 71 mL/min (ref >60.00)
Glucose, Bld: 102 mg/dL — ABNORMAL HIGH (ref 70–99)
Potassium: 4.4 mEq/L (ref 3.5–5.1)
Sodium: 139 mEq/L (ref 135–145)

## 2022-11-27 NOTE — Progress Notes (Unsigned)
   Subjective:  Patient ID: Angel Kim, female    DOB: Sep 29, 1941  Age: 82 y.o. MRN: [id number]  CC: No chief complaint on file.   HPI Belma Dyches presents for ***  Outpatient Medications Prior to Visit  Medication Sig Dispense Refill   amLODipine (NORVASC) 5 MG tablet Take 1 tablet (5 mg total) by mouth daily. 90 tablet 0   aspirin EC 81 MG tablet Take 81 mg by mouth daily. Swallow whole.     atorvastatin (LIPITOR) 40 MG tablet TAKE 1 TABLET BY MOUTH EVERY DAY 90 tablet 0   estradiol (ESTRACE) 0.5 MG tablet Take 1 tablet (0.5 mg total) by mouth daily. 90 tablet 0   Fexofenadine HCl (ALLEGRA ALLERGY PO) Take by mouth.     indapamide (LOZOL) 1.25 MG tablet TAKE 1 TABLET BY MOUTH DAILY. 90 tablet 0   metoprolol tartrate (LOPRESSOR) 50 MG tablet Take 1 tablet (50 mg total) by mouth 2 (two) times daily. 180 tablet 0   Multiple Vitamin (MULTIVITAMIN ADULT PO) Take 1 capsule by mouth daily.     NIACIN PO Take 1 capsule by mouth at bedtime.     olmesartan (BENICAR) 20 MG tablet Take 1 tablet (20 mg total) by mouth daily. 90 tablet 0   Oxybutynin Chloride 2.5 MG TABS Take 1 tablet by mouth 3 (three) times daily as needed (bladder spasm). 15 tablet 0   No facility-administered medications prior to visit.    ROS Review of Systems  Objective:  There were no vitals taken for this visit.  BP Readings from Last 3 Encounters:  08/27/22 (!) 156/76  03/30/22 (!) 151/75  02/15/22 (!) 142/82    Wt Readings from Last 3 Encounters:  08/27/22 135 lb (61.2 kg)  03/30/22 133 lb 6.4 oz (60.5 kg)  02/15/22 136 lb (61.7 kg)    Physical Exam  Lab Results  Component Value Date   WBC 9.1 08/27/2022   HGB 13.8 08/27/2022   HCT 40.6 08/27/2022   PLT 257.0 08/27/2022   GLUCOSE 102 (H) 08/27/2022   CHOL 179 02/15/2022   TRIG 89.0 02/15/2022   HDL 67.30 02/15/2022   LDLCALC 94 02/15/2022   ALT 25 02/15/2022   AST 22 02/15/2022   NA 141 08/27/2022   K 3.6 08/27/2022   CL 104 08/27/2022    CREATININE 0.78 08/27/2022   BUN 23 08/27/2022   CO2 28 08/27/2022   TSH 2.15 02/15/2022   HGBA1C 5.9 08/27/2022    No results found.  Assessment & Plan:   There are no diagnoses linked to this encounter. I am having Barnett Applebaum maintain her Fexofenadine HCl (ALLEGRA ALLERGY PO), Multiple Vitamin (MULTIVITAMIN ADULT PO), NIACIN PO, aspirin EC, oxyBUTYnin Chloride, indapamide, olmesartan, metoprolol tartrate, amLODipine, estradiol, and atorvastatin.  No orders of the defined types were placed in this encounter.    Follow-up: No follow-ups on file.  Scarlette Calico, MD

## 2022-11-27 NOTE — Patient Instructions (Signed)
Hypertension, Adult High blood pressure (hypertension) is when the force of blood pumping through the arteries is too strong. The arteries are the blood vessels that carry blood from the heart throughout the body. Hypertension forces the heart to work harder to pump blood and may cause arteries to become narrow or stiff. Untreated or uncontrolled hypertension can lead to a heart attack, heart failure, a stroke, kidney disease, and other problems. A blood pressure reading consists of a higher number over a lower number. Ideally, your blood pressure should be below 120/80. The first ("top") number is called the systolic pressure. It is a measure of the pressure in your arteries as your heart beats. The second ("bottom") number is called the diastolic pressure. It is a measure of the pressure in your arteries as the heart relaxes. What are the causes? The exact cause of this condition is not known. There are some conditions that result in high blood pressure. What increases the risk? Certain factors may make you more likely to develop high blood pressure. Some of these risk factors are under your control, including: Smoking. Not getting enough exercise or physical activity. Being overweight. Having too much fat, sugar, calories, or salt (sodium) in your diet. Drinking too much alcohol. Other risk factors include: Having a personal history of heart disease, diabetes, high cholesterol, or kidney disease. Stress. Having a family history of high blood pressure and high cholesterol. Having obstructive sleep apnea. Age. The risk increases with age. What are the signs or symptoms? High blood pressure may not cause symptoms. Very high blood pressure (hypertensive crisis) may cause: Headache. Fast or irregular heartbeats (palpitations). Shortness of breath. Nosebleed. Nausea and vomiting. Vision changes. Severe chest pain, dizziness, and seizures. How is this diagnosed? This condition is diagnosed by  measuring your blood pressure while you are seated, with your arm resting on a flat surface, your legs uncrossed, and your feet flat on the floor. The cuff of the blood pressure monitor will be placed directly against the skin of your upper arm at the level of your heart. Blood pressure should be measured at least twice using the same arm. Certain conditions can cause a difference in blood pressure between your right and left arms. If you have a high blood pressure reading during one visit or you have normal blood pressure with other risk factors, you may be asked to: Return on a different day to have your blood pressure checked again. Monitor your blood pressure at home for 1 week or longer. If you are diagnosed with hypertension, you may have other blood or imaging tests to help your health care provider understand your overall risk for other conditions. How is this treated? This condition is treated by making healthy lifestyle changes, such as eating healthy foods, exercising more, and reducing your alcohol intake. You may be referred for counseling on a healthy diet and physical activity. Your health care provider may prescribe medicine if lifestyle changes are not enough to get your blood pressure under control and if: Your systolic blood pressure is above 130. Your diastolic blood pressure is above 80. Your personal target blood pressure may vary depending on your medical conditions, your age, and other factors. Follow these instructions at home: Eating and drinking  Eat a diet that is high in fiber and potassium, and low in sodium, added sugar, and fat. An example of this eating plan is called the DASH diet. DASH stands for Dietary Approaches to Stop Hypertension. To eat this way: Eat   plenty of fresh fruits and vegetables. Try to fill one half of your plate at each meal with fruits and vegetables. Eat whole grains, such as whole-wheat pasta, brown rice, or whole-grain bread. Fill about one  fourth of your plate with whole grains. Eat or drink low-fat dairy products, such as skim milk or low-fat yogurt. Avoid fatty cuts of meat, processed or cured meats, and poultry with skin. Fill about one fourth of your plate with lean proteins, such as fish, chicken without skin, beans, eggs, or tofu. Avoid pre-made and processed foods. These tend to be higher in sodium, added sugar, and fat. Reduce your daily sodium intake. Many people with hypertension should eat less than 1,500 mg of sodium a day. Do not drink alcohol if: Your health care provider tells you not to drink. You are pregnant, may be pregnant, or are planning to become pregnant. If you drink alcohol: Limit how much you have to: 0-1 drink a day for women. 0-2 drinks a day for men. Know how much alcohol is in your drink. In the [place] one drink equals one 12 oz bottle of beer (355 mL), one 5 oz glass of wine (148 mL), or one 1 oz glass of hard liquor (44 mL). Lifestyle  Work with your health care provider to maintain a healthy body weight or to lose weight. Ask what an ideal weight is for you. Get at least 30 minutes of exercise that causes your heart to beat faster (aerobic exercise) most days of the week. Activities may include walking, swimming, or biking. Include exercise to strengthen your muscles (resistance exercise), such as Pilates or lifting weights, as part of your weekly exercise routine. Try to do these types of exercises for 30 minutes at least 3 days a week. Do not use any products that contain nicotine or tobacco. These products include cigarettes, chewing tobacco, and vaping devices, such as e-cigarettes. If you need help quitting, ask your health care provider. Monitor your blood pressure at home as told by your health care provider. Keep all follow-up visits. This is important. Medicines Take over-the-counter and prescription medicines only as told by your health care provider. Follow directions carefully. Blood  pressure medicines must be taken as prescribed. Do not skip doses of blood pressure medicine. Doing this puts you at risk for problems and can make the medicine less effective. Ask your health care provider about side effects or reactions to medicines that you should watch for. Contact a health care provider if you: Think you are having a reaction to a medicine you are taking. Have headaches that keep coming back (recurring). Feel dizzy. Have swelling in your ankles. Have trouble with your vision. Get help right away if you: Develop a severe headache or confusion. Have unusual weakness or numbness. Feel faint. Have severe pain in your chest or abdomen. Vomit repeatedly. Have trouble breathing. These symptoms may be an emergency. Get help right away. Call 911. Do not wait to see if the symptoms will go away. Do not drive yourself to the hospital. Summary Hypertension is when the force of blood pumping through your arteries is too strong. If this condition is not controlled, it may put you at risk for serious complications. Your personal target blood pressure may vary depending on your medical conditions, your age, and other factors. For most people, a normal blood pressure is less than 120/80. Hypertension is treated with lifestyle changes, medicines, or a combination of both. Lifestyle changes include losing weight, eating a healthy,   low-sodium diet, exercising more, and limiting alcohol. This information is not intended to replace advice given to you by your health care provider. Make sure you discuss any questions you have with your health care provider. Document Revised: 08/22/2021 Document Reviewed: 08/22/2021 Elsevier Patient Education  2023 [employer]  

## 2022-12-01 ENCOUNTER — Other Ambulatory Visit: Payer: Self-pay | Admitting: Internal Medicine

## 2022-12-01 DIAGNOSIS — I10 Essential (primary) hypertension: Secondary | ICD-10-CM

## 2022-12-11 ENCOUNTER — Encounter: Payer: Self-pay | Admitting: Internal Medicine

## 2023-01-12 ENCOUNTER — Other Ambulatory Visit: Payer: Self-pay | Admitting: Internal Medicine

## 2023-01-12 DIAGNOSIS — R000 Tachycardia, unspecified: Secondary | ICD-10-CM

## 2023-01-12 DIAGNOSIS — I10 Essential (primary) hypertension: Secondary | ICD-10-CM

## 2023-01-29 ENCOUNTER — Ambulatory Visit
Admission: RE | Admit: 2023-01-29 | Discharge: 2023-01-29 | Disposition: A | Discharge status: Home / Self Care | Payer: Medicare Other | Source: Ambulatory Visit | Attending: Internal Medicine | Admitting: Internal Medicine

## 2023-01-29 ENCOUNTER — Other Ambulatory Visit: Payer: Self-pay | Admitting: Internal Medicine

## 2023-01-29 DIAGNOSIS — I10 Essential (primary) hypertension: Secondary | ICD-10-CM

## 2023-01-29 DIAGNOSIS — Z1231 Encounter for screening mammogram for malignant neoplasm of breast: Secondary | ICD-10-CM

## 2023-01-29 DIAGNOSIS — N1832 Chronic kidney disease, stage 3b: Secondary | ICD-10-CM

## 2023-02-01 ENCOUNTER — Other Ambulatory Visit: Payer: Self-pay | Admitting: Internal Medicine

## 2023-02-01 DIAGNOSIS — E785 Hyperlipidemia, unspecified: Secondary | ICD-10-CM

## 2023-02-01 DIAGNOSIS — N952 Postmenopausal atrophic vaginitis: Secondary | ICD-10-CM

## 2023-02-26 ENCOUNTER — Telehealth: Payer: Self-pay

## 2023-02-26 NOTE — Telephone Encounter (Signed)
Called patient to schedule Medicare Annual Wellness Visit (AWV). Left message for patient to call back and schedule Medicare Annual Wellness Visit (AWV).  Last date of AWV: 03/07/22  Please schedule an appointment at any time On Annual Wellness Visit Schedule.

## 2023-03-07 ENCOUNTER — Other Ambulatory Visit: Payer: Self-pay | Admitting: Internal Medicine

## 2023-03-07 DIAGNOSIS — I10 Essential (primary) hypertension: Secondary | ICD-10-CM

## 2023-03-07 MED ORDER — AMLODIPINE BESYLATE 5 MG PO TABS
5.0000 mg | ORAL_TABLET | Freq: Every day | ORAL | 0 refills | Status: DC
Start: 2023-03-07 — End: 2023-05-22

## 2023-03-07 MED ORDER — OLMESARTAN MEDOXOMIL 20 MG PO TABS
20.0000 mg | ORAL_TABLET | Freq: Every day | ORAL | 0 refills | Status: DC
Start: 2023-03-07 — End: 2023-05-22

## 2023-03-07 MED ORDER — INDAPAMIDE 1.25 MG PO TABS: 1.2500 mg | ORAL_TABLET | Freq: Every day | ORAL | 0 refills | Status: DC

## 2023-04-08 ENCOUNTER — Other Ambulatory Visit: Payer: Self-pay | Admitting: Internal Medicine

## 2023-04-08 DIAGNOSIS — I10 Essential (primary) hypertension: Secondary | ICD-10-CM

## 2023-04-08 DIAGNOSIS — R000 Tachycardia, unspecified: Secondary | ICD-10-CM

## 2023-04-10 ENCOUNTER — Encounter: Payer: Self-pay | Admitting: Internal Medicine

## 2023-04-29 ENCOUNTER — Ambulatory Visit (INDEPENDENT_AMBULATORY_CARE_PROVIDER_SITE_OTHER): Payer: Medicare Other | Admitting: Internal Medicine

## 2023-04-29 ENCOUNTER — Other Ambulatory Visit: Payer: Self-pay | Admitting: Internal Medicine

## 2023-04-29 ENCOUNTER — Encounter: Payer: Self-pay | Admitting: Internal Medicine

## 2023-04-29 VITALS — BP 134/76 | HR 76 | Temp 97.9°F | Ht 65.0 in | Wt 137.0 lb

## 2023-04-29 DIAGNOSIS — N1832 Chronic kidney disease, stage 3b: Secondary | ICD-10-CM

## 2023-04-29 DIAGNOSIS — R739 Hyperglycemia, unspecified: Secondary | ICD-10-CM | POA: Diagnosis not present

## 2023-04-29 DIAGNOSIS — I10 Essential (primary) hypertension: Secondary | ICD-10-CM

## 2023-04-29 DIAGNOSIS — E785 Hyperlipidemia, unspecified: Secondary | ICD-10-CM

## 2023-04-29 DIAGNOSIS — J0100 Acute maxillary sinusitis, unspecified: Secondary | ICD-10-CM | POA: Insufficient documentation

## 2023-04-29 LAB — BASIC METABOLIC PANEL
BUN: 23 mg/dL (ref 6–23)
CO2: 30 mEq/L (ref 19–32)
Calcium: 10.5 mg/dL (ref 8.4–10.5)
Chloride: 101 mEq/L (ref 96–112)
Creatinine, Ser: 0.92 mg/dL (ref 0.40–1.20)
GFR: 58.97 mL/min — ABNORMAL LOW (ref >60.00)
Glucose, Bld: 94 mg/dL (ref 70–99)
Potassium: 4.3 mEq/L (ref 3.5–5.1)
Sodium: 140 mEq/L (ref 135–145)

## 2023-04-29 LAB — CBC WITH DIFFERENTIAL/PLATELET
Basophils Absolute: 0.1 10*3/uL (ref 0.0–0.1)
Basophils Relative: 1.3 % (ref 0.0–3.0)
Eosinophils Absolute: 0.2 10*3/uL (ref 0.0–0.7)
Eosinophils Relative: 2.4 % (ref 0.0–5.0)
HCT: 42 % (ref 36.0–46.0)
Hemoglobin: 13.9 g/dL (ref 12.0–15.0)
Lymphocytes Relative: 32 % (ref 12.0–46.0)
Lymphs Abs: 2.5 10*3/uL (ref 0.7–4.0)
MCHC: 33.1 g/dL (ref 30.0–36.0)
MCV: 86.9 fl (ref 78.0–100.0)
Monocytes Absolute: 0.6 10*3/uL (ref 0.1–1.0)
Monocytes Relative: 7.9 % (ref 3.0–12.0)
Neutro Abs: 4.3 10*3/uL (ref 1.4–7.7)
Neutrophils Relative %: 56.4 % (ref 43.0–77.0)
Platelets: 258 10*3/uL (ref 150.0–400.0)
RBC: 4.84 Mil/uL (ref 3.87–5.11)
RDW: 14 % (ref 11.5–15.5)
WBC: 7.7 10*3/uL (ref 4.0–10.5)

## 2023-04-29 LAB — HEMOGLOBIN A1C: Hgb A1c MFr Bld: 5.7 % (ref 4.6–6.5)

## 2023-04-29 LAB — HEPATIC FUNCTION PANEL
ALT: 24 U/L (ref 0–35)
AST: 22 U/L (ref 0–37)
Albumin: 4.5 g/dL (ref 3.5–5.2)
Alkaline Phosphatase: 71 U/L (ref 39–117)
Bilirubin, Direct: 0.1 mg/dL (ref 0.0–0.3)
Total Bilirubin: 0.4 mg/dL (ref 0.2–1.2)
Total Protein: 7.9 g/dL (ref 6.0–8.3)

## 2023-04-29 LAB — LIPID PANEL
Cholesterol: 194 mg/dL (ref 0–200)
HDL: 58.6 mg/dL (ref >39.00)
NonHDL: 135.23
Total CHOL/HDL Ratio: 3
Triglycerides: 223 mg/dL — ABNORMAL HIGH (ref 0.0–149.0)
VLDL: 44.6 mg/dL — ABNORMAL HIGH (ref 0.0–40.0)

## 2023-04-29 LAB — LDL CHOLESTEROL, DIRECT: Direct LDL: 94 mg/dL

## 2023-04-29 LAB — TSH: TSH: 1.99 u[IU]/mL (ref 0.35–5.50)

## 2023-04-29 MED ORDER — AMOXICILLIN-POT CLAVULANATE 875-125 MG PO TABS
1.0000 | ORAL_TABLET | Freq: Two times a day (BID) | ORAL | 0 refills | Status: AC
Start: 2023-04-29 — End: 2023-05-09

## 2023-04-29 NOTE — Patient Instructions (Signed)
Hypertension, Adult High blood pressure (hypertension) is when the force of blood pumping through the arteries is too strong. The arteries are the blood vessels that carry blood from the heart throughout the body. Hypertension forces the heart to work harder to pump blood and may cause arteries to become narrow or stiff. Untreated or uncontrolled hypertension can lead to a heart attack, heart failure, a stroke, kidney disease, and other problems. A blood pressure reading consists of a higher number over a lower number. Ideally, your blood pressure should be below 120/80. The first ("top") number is called the systolic pressure. It is a measure of the pressure in your arteries as your heart beats. The second ("bottom") number is called the diastolic pressure. It is a measure of the pressure in your arteries as the heart relaxes. What are the causes? The exact cause of this condition is not known. There are some conditions that result in high blood pressure. What increases the risk? Certain factors may make you more likely to develop high blood pressure. Some of these risk factors are under your control, including: Smoking. Not getting enough exercise or physical activity. Being overweight. Having too much fat, sugar, calories, or salt (sodium) in your diet. Drinking too much alcohol. Other risk factors include: Having a personal history of heart disease, diabetes, high cholesterol, or kidney disease. Stress. Having a family history of high blood pressure and high cholesterol. Having obstructive sleep apnea. Age. The risk increases with age. What are the signs or symptoms? High blood pressure may not cause symptoms. Very high blood pressure (hypertensive crisis) may cause: Headache. Fast or irregular heartbeats (palpitations). Shortness of breath. Nosebleed. Nausea and vomiting. Vision changes. Severe chest pain, dizziness, and seizures. How is this diagnosed? This condition is diagnosed by  measuring your blood pressure while you are seated, with your arm resting on a flat surface, your legs uncrossed, and your feet flat on the floor. The cuff of the blood pressure monitor will be placed directly against the skin of your upper arm at the level of your heart. Blood pressure should be measured at least twice using the same arm. Certain conditions can cause a difference in blood pressure between your right and left arms. If you have a high blood pressure reading during one visit or you have normal blood pressure with other risk factors, you may be asked to: Return on a different day to have your blood pressure checked again. Monitor your blood pressure at home for 1 week or longer. If you are diagnosed with hypertension, you may have other blood or imaging tests to help your health care provider understand your overall risk for other conditions. How is this treated? This condition is treated by making healthy lifestyle changes, such as eating healthy foods, exercising more, and reducing your alcohol intake. You may be referred for counseling on a healthy diet and physical activity. Your health care provider may prescribe medicine if lifestyle changes are not enough to get your blood pressure under control and if: Your systolic blood pressure is above 130. Your diastolic blood pressure is above 80. Your personal target blood pressure may vary depending on your medical conditions, your age, and other factors. Follow these instructions at home: Eating and drinking  Eat a diet that is high in fiber and potassium, and low in sodium, added sugar, and fat. An example of this eating plan is called the DASH diet. DASH stands for Dietary Approaches to Stop Hypertension. To eat this way: Eat   plenty of fresh fruits and vegetables. Try to fill one half of your plate at each meal with fruits and vegetables. Eat whole grains, such as whole-wheat pasta, brown rice, or whole-grain bread. Fill about one  fourth of your plate with whole grains. Eat or drink low-fat dairy products, such as skim milk or low-fat yogurt. Avoid fatty cuts of meat, processed or cured meats, and poultry with skin. Fill about one fourth of your plate with lean proteins, such as fish, chicken without skin, beans, eggs, or tofu. Avoid pre-made and processed foods. These tend to be higher in sodium, added sugar, and fat. Reduce your daily sodium intake. Many people with hypertension should eat less than 1,500 mg of sodium a day. Do not drink alcohol if: Your health care provider tells you not to drink. You are pregnant, may be pregnant, or are planning to become pregnant. If you drink alcohol: Limit how much you have to: 0-1 drink a day for women. 0-2 drinks a day for men. Know how much alcohol is in your drink. In the [place] one drink equals one 12 oz bottle of beer (355 mL), one 5 oz glass of wine (148 mL), or one 1 oz glass of hard liquor (44 mL). Lifestyle  Work with your health care provider to maintain a healthy body weight or to lose weight. Ask what an ideal weight is for you. Get at least 30 minutes of exercise that causes your heart to beat faster (aerobic exercise) most days of the week. Activities may include walking, swimming, or biking. Include exercise to strengthen your muscles (resistance exercise), such as Pilates or lifting weights, as part of your weekly exercise routine. Try to do these types of exercises for 30 minutes at least 3 days a week. Do not use any products that contain nicotine or tobacco. These products include cigarettes, chewing tobacco, and vaping devices, such as e-cigarettes. If you need help quitting, ask your health care provider. Monitor your blood pressure at home as told by your health care provider. Keep all follow-up visits. This is important. Medicines Take over-the-counter and prescription medicines only as told by your health care provider. Follow directions carefully. Blood  pressure medicines must be taken as prescribed. Do not skip doses of blood pressure medicine. Doing this puts you at risk for problems and can make the medicine less effective. Ask your health care provider about side effects or reactions to medicines that you should watch for. Contact a health care provider if you: Think you are having a reaction to a medicine you are taking. Have headaches that keep coming back (recurring). Feel dizzy. Have swelling in your ankles. Have trouble with your vision. Get help right away if you: Develop a severe headache or confusion. Have unusual weakness or numbness. Feel faint. Have severe pain in your chest or abdomen. Vomit repeatedly. Have trouble breathing. These symptoms may be an emergency. Get help right away. Call 911. Do not wait to see if the symptoms will go away. Do not drive yourself to the hospital. Summary Hypertension is when the force of blood pumping through your arteries is too strong. If this condition is not controlled, it may put you at risk for serious complications. Your personal target blood pressure may vary depending on your medical conditions, your age, and other factors. For most people, a normal blood pressure is less than 120/80. Hypertension is treated with lifestyle changes, medicines, or a combination of both. Lifestyle changes include losing weight, eating a healthy,   low-sodium diet, exercising more, and limiting alcohol. This information is not intended to replace advice given to you by your health care provider. Make sure you discuss any questions you have with your health care provider. Document Revised: 08/22/2021 Document Reviewed: 08/22/2021 Elsevier Patient Education  2024 Else[employer] Inc.  

## 2023-04-29 NOTE — Progress Notes (Signed)
Subjective:  Patient ID: Angel Kim, female    DOB: Nov 11, 1940  Age: 82 y.o. MRN: [id number]  CC: Hypertension, Hyperlipidemia, and Sinusitis   HPI Angel Kim presents for f/up ---  Discussed the use of AI scribe software for clinical note transcription with the patient, who gave verbal consent to proceed.  History of Present Illness   The patient, with a history of hypercholesterolemia managed with niacin, presented for a routine checkup. They reported two recent episodes of nocturnal flushing and warmth, which resolved with 25mg  of Benadryl. The patient speculated that these episodes might be related to their niacin intake, which was not the flush-free variant. Since switching to a different form of niacin, they have not experienced further episodes.  The patient also reported occasional lightheadedness and dizziness, which they attributed to age. They denied any significant weight changes, unusual fatigue, or shortness of breath. However, they c/o sinus pain and green nasal discharge, which they manage with daily Allegra.   For arthritic pain, the patient occasionally takes Tylenol Arthritis. They also take a daily 81mg  aspirin for cardiovascular health but denied taking any other blood thinners or medications for mental health. The patient's cholesterol is well-managed with statins, and they reported no associated joint aches, pains, or muscle stiffness.       Outpatient Medications Prior to Visit  Medication Sig Dispense Refill   amLODipine (NORVASC) 5 MG tablet Take 1 tablet (5 mg total) by mouth daily. 90 tablet 0   aspirin EC 81 MG tablet Take 81 mg by mouth daily. Swallow whole.     atorvastatin (LIPITOR) 40 MG tablet TAKE 1 TABLET BY MOUTH EVERY DAY 90 tablet 0   Fexofenadine HCl (ALLEGRA ALLERGY PO) Take by mouth.     indapamide (LOZOL) 1.25 MG tablet Take 1 tablet (1.25 mg total) by mouth daily. 90 tablet 0   metoprolol tartrate (LOPRESSOR) 50 MG tablet TAKE 1 TABLET BY  MOUTH TWICE A DAY 180 tablet 0   Multiple Vitamin (MULTIVITAMIN ADULT PO) Take 1 capsule by mouth daily.     NIACIN PO Take 1 capsule by mouth at bedtime.     olmesartan (BENICAR) 20 MG tablet Take 1 tablet (20 mg total) by mouth daily. 90 tablet 0   estradiol (ESTRACE) 0.5 MG tablet TAKE 1 TABLET BY MOUTH EVERY DAY 90 tablet 0   No facility-administered medications prior to visit.    ROS Review of Systems  Constitutional: Negative.  Negative for appetite change, chills, diaphoresis, fatigue and fever.  HENT:  Positive for postnasal drip, rhinorrhea, sinus pressure and sinus pain. Negative for congestion, facial swelling, nosebleeds, sneezing and sore throat.        She complains of a 2-week history of facial pain, thick yellow/green nasal phlegm, and postnasal drip.  Eyes: Negative.   Respiratory: Negative.  Negative for apnea, cough, shortness of breath and wheezing.   Cardiovascular:  Negative for chest pain, palpitations and leg swelling.  Gastrointestinal:  Negative for abdominal pain, diarrhea and nausea.  Endocrine: Negative.   Genitourinary: Negative.  Negative for difficulty urinating and dysuria.  Musculoskeletal: Negative.   Skin: Negative.   Neurological:  Positive for dizziness and light-headedness. Negative for weakness and headaches.  Hematological:  Negative for adenopathy. Does not bruise/bleed easily.  Psychiatric/Behavioral: Negative.      Objective:  BP 134/76 (BP Location: Left Arm, Patient Position: Sitting, Cuff Size: Large)   Pulse 76   Temp 97.9 F (36.6 C) (Oral)   Ht 5\' 5"  (1.651  m)   Wt 137 lb (62.1 kg)   SpO2 94%   BMI 22.80 kg/m   BP Readings from Last 3 Encounters:  04/29/23 134/76  11/27/22 (!) 142/74  08/27/22 (!) 156/76    Wt Readings from Last 3 Encounters:  04/29/23 137 lb (62.1 kg)  11/27/22 139 lb (63 kg)  08/27/22 135 lb (61.2 kg)    Physical Exam Vitals reviewed.  Constitutional:      Appearance: Normal appearance.  HENT:      Nose: Nose normal.     Mouth/Throat:     Mouth: Mucous membranes are moist.  Eyes:     General: No scleral icterus.    Conjunctiva/sclera: Conjunctivae normal.  Cardiovascular:     Rate and Rhythm: Normal rate and regular rhythm.     Heart sounds: No murmur heard. Pulmonary:     Effort: Pulmonary effort is normal.     Breath sounds: No stridor. No wheezing, rhonchi or rales.  Abdominal:     General: Abdomen is flat.     Palpations: There is no mass.     Tenderness: There is no abdominal tenderness. There is no guarding.     Hernia: No hernia is present.  Musculoskeletal:        General: Normal range of motion.     Cervical back: Neck supple.     Right lower leg: No edema.     Left lower leg: No edema.  Lymphadenopathy:     Cervical: No cervical adenopathy.  Skin:    General: Skin is warm and dry.  Neurological:     General: No focal deficit present.     Mental Status: She is alert. Mental status is at baseline.  Psychiatric:        Mood and Affect: Mood normal.        Behavior: Behavior normal.     Lab Results  Component Value Date   WBC 7.7 04/29/2023   HGB 13.9 04/29/2023   HCT 42.0 04/29/2023   PLT 258.0 04/29/2023   GLUCOSE 94 04/29/2023   CHOL 194 04/29/2023   TRIG 223.0 (H) 04/29/2023   HDL 58.60 04/29/2023   LDLDIRECT 94.0 04/29/2023   LDLCALC 94 02/15/2022   ALT 24 04/29/2023   AST 22 04/29/2023   NA 140 04/29/2023   K 4.3 04/29/2023   CL 101 04/29/2023   CREATININE 0.92 04/29/2023   BUN 23 04/29/2023   CO2 30 04/29/2023   TSH 1.99 04/29/2023   HGBA1C 5.7 04/29/2023    MM 3D SCREENING MAMMOGRAM BILATERAL BREAST  Result Date: 01/30/2023 CLINICAL DATA:  Screening. EXAM: DIGITAL SCREENING BILATERAL MAMMOGRAM WITH TOMOSYNTHESIS AND CAD TECHNIQUE: Bilateral screening digital craniocaudal and mediolateral oblique mammograms were obtained. Bilateral screening digital breast tomosynthesis was performed. The images were evaluated with computer-aided  detection. COMPARISON:  Previous exam(s). ACR Breast Density Category d: The breasts are extremely dense, which lowers the sensitivity of mammography. FINDINGS: There are no findings suspicious for malignancy. IMPRESSION: No mammographic evidence of malignancy. A result letter of this screening mammogram will be mailed directly to the patient. RECOMMENDATION: Screening mammogram in one year. (Code:SM-B-01Y) BI-RADS CATEGORY  1: Negative. Electronically Signed   By: Emmaline Kluver M.D.   On: 01/30/2023 15:38    Assessment & Plan:   Chronic hyperglycemia -     Basic metabolic panel; Future -     Hemoglobin A1c; Future  Hyperlipidemia LDL goal <100- LDL goal achieved. Doing well on the statin  -  Lipid panel; Future -     Lipoprotein A (LPA); Future -     Hepatic function panel; Future -     TSH; Future  Primary hypertension- Her BP is well controlled. -     Basic metabolic panel; Future -     CBC with Differential/Platelet; Future -     Hepatic function panel; Future -     TSH; Future  Stage 3b chronic kidney disease (HCC) -     Basic metabolic panel; Future -     Urinalysis, Routine w reflex microscopic; Future  Acute non-recurrent maxillary sinusitis -     Amoxicillin-Pot Clavulanate; Take 1 tablet by mouth 2 (two) times daily for 10 days.  Dispense: 20 tablet; Refill: 0  Other orders -     LDL cholesterol, direct     Follow-up: Return in about 4 months (around 08/30/2023).  Sanda Linger, MD

## 2023-04-30 ENCOUNTER — Other Ambulatory Visit: Payer: Self-pay | Admitting: Internal Medicine

## 2023-04-30 DIAGNOSIS — N952 Postmenopausal atrophic vaginitis: Secondary | ICD-10-CM

## 2023-04-30 LAB — URINALYSIS, ROUTINE W REFLEX MICROSCOPIC
Bilirubin Urine: NEGATIVE
Hgb urine dipstick: NEGATIVE
Ketones, ur: NEGATIVE
Leukocytes,Ua: NEGATIVE
Nitrite: NEGATIVE
RBC / HPF: NONE SEEN (ref 0–?)
Specific Gravity, Urine: 1.01 (ref 1.000–1.030)
Total Protein, Urine: NEGATIVE
Urine Glucose: NEGATIVE
Urobilinogen, UA: 0.2 (ref 0.0–1.0)
WBC, UA: NONE SEEN (ref 0–?)
pH: 6 (ref 5.0–8.0)

## 2023-05-03 LAB — LIPOPROTEIN A (LPA): Lipoprotein (a): 44 nmol/L (ref <75)

## 2023-05-22 ENCOUNTER — Other Ambulatory Visit: Payer: Self-pay | Admitting: Internal Medicine

## 2023-05-22 DIAGNOSIS — I10 Essential (primary) hypertension: Secondary | ICD-10-CM

## 2023-05-23 MED ORDER — AMLODIPINE BESYLATE 5 MG PO TABS
5.0000 mg | ORAL_TABLET | Freq: Every day | ORAL | 0 refills | Status: DC
Start: 2023-05-23 — End: 2023-06-10

## 2023-05-23 MED ORDER — OLMESARTAN MEDOXOMIL 20 MG PO TABS
20.0000 mg | ORAL_TABLET | Freq: Every day | ORAL | 0 refills | Status: AC
Start: 2023-05-23 — End: ?

## 2023-05-23 MED ORDER — INDAPAMIDE 1.25 MG PO TABS
1.2500 mg | ORAL_TABLET | Freq: Every day | ORAL | 0 refills | Status: AC
Start: 2023-05-23 — End: ?

## 2023-05-28 ENCOUNTER — Ambulatory Visit: Payer: Medicare Other | Admitting: Internal Medicine

## 2023-06-07 ENCOUNTER — Encounter: Payer: Self-pay | Admitting: Internal Medicine

## 2023-06-10 ENCOUNTER — Encounter: Payer: Self-pay | Admitting: Internal Medicine

## 2023-06-10 ENCOUNTER — Ambulatory Visit (INDEPENDENT_AMBULATORY_CARE_PROVIDER_SITE_OTHER): Payer: Medicare Other | Admitting: Internal Medicine

## 2023-06-10 VITALS — BP 156/62 | HR 75 | Temp 97.8°F | Resp 16 | Ht 65.0 in | Wt 137.0 lb

## 2023-06-10 DIAGNOSIS — I10 Essential (primary) hypertension: Secondary | ICD-10-CM | POA: Diagnosis not present

## 2023-06-10 DIAGNOSIS — R9431 Abnormal electrocardiogram [ECG] [EKG]: Secondary | ICD-10-CM | POA: Insufficient documentation

## 2023-06-10 DIAGNOSIS — N1832 Chronic kidney disease, stage 3b: Secondary | ICD-10-CM

## 2023-06-10 DIAGNOSIS — J0141 Acute recurrent pansinusitis: Secondary | ICD-10-CM

## 2023-06-10 DIAGNOSIS — T733XXA Exhaustion due to excessive exertion, initial encounter: Secondary | ICD-10-CM | POA: Diagnosis not present

## 2023-06-10 DIAGNOSIS — R300 Dysuria: Secondary | ICD-10-CM | POA: Insufficient documentation

## 2023-06-10 LAB — TROPONIN I (HIGH SENSITIVITY): High Sens Troponin I: 6 ng/L (ref 2–17)

## 2023-06-10 MED ORDER — AMLODIPINE BESYLATE 10 MG PO TABS
10.0000 mg | ORAL_TABLET | Freq: Every day | ORAL | 0 refills | Status: AC
Start: 2023-06-10 — End: ?

## 2023-06-10 NOTE — Patient Instructions (Signed)
Fatigue If you have fatigue, you feel tired all the time and have a lack of energy or a lack of motivation. Fatigue may make it difficult to start or complete tasks because of exhaustion. Occasional or mild fatigue is often a normal response to activity or life. However, long-term (chronic) or extreme fatigue may be a symptom of a medical condition such as: Depression. Not having enough red blood cells or hemoglobin in the blood (anemia). A problem with a small gland located in the lower front part of the neck (thyroid disorder). Rheumatologic conditions. These are problems related to the body's defense system (immune system). Infections, especially certain viral infections. Fatigue can also lead to negative health outcomes over time. Follow these instructions at home: Medicines Take over-the-counter and prescription medicines only as told by your health care provider. Take a multivitamin if told by your health care provider. Do not use herbal or dietary supplements unless they are approved by your health care provider. Eating and drinking  Avoid heavy meals in the evening. Eat a well-balanced diet, which includes lean proteins, whole grains, plenty of fruits and vegetables, and low-fat dairy products. Avoid eating or drinking too many products with caffeine in them. Avoid alcohol. Drink enough fluid to keep your urine pale yellow. Activity  Exercise regularly, as told by your health care provider. Use or practice techniques to help you relax, such as yoga, tai chi, meditation, or massage therapy. Lifestyle Change situations that cause you stress. Try to keep your work and personal schedules in balance. Do not use recreational or illegal drugs. General instructions Monitor your fatigue for any changes. Go to bed and get up at the same time every day. Avoid fatigue by pacing yourself during the day and getting enough sleep at night. Maintain a healthy weight. Contact a health care  provider if: Your fatigue does not get better. You have a fever. You suddenly lose or gain weight. You have headaches. You have trouble falling asleep or sleeping through the night. You feel angry, guilty, anxious, or sad. You have swelling in your legs or another part of your body. Get help right away if: You feel confused, feel like you might faint, or faint. Your vision is blurry or you have a severe headache. You have severe pain in your abdomen, your back, or the area between your waist and hips (pelvis). You have chest pain, shortness of breath, or an irregular or fast heartbeat. You are unable to urinate, or you urinate less than normal. You have abnormal bleeding from the rectum, nose, lungs, nipples, or, if you are female, the vagina. You vomit blood. You have thoughts about hurting yourself or others. These symptoms may be an emergency. Get help right away. Call 911. Do not wait to see if the symptoms will go away. Do not drive yourself to the hospital. Get help right away if you feel like you may hurt yourself or others, or have thoughts about taking your own life. Go to your nearest emergency room or: Call 911. Call the National Suicide Prevention Lifeline at 1-800-273-8255 or 988. This is open 24 hours a day. Text the Crisis Text Line at 741741. Summary If you have fatigue, you feel tired all the time and have a lack of energy or a lack of motivation. Fatigue may make it difficult to start or complete tasks because of exhaustion. Long-term (chronic) or extreme fatigue may be a symptom of a medical condition. Exercise regularly, as told by your health care provider.   Change situations that cause you stress. Try to keep your work and personal schedules in balance. This information is not intended to replace advice given to you by your health care provider. Make sure you discuss any questions you have with your health care provider. Document Revised: 08/07/2021 Document  Reviewed: 08/07/2021 Elsevier Patient Education  2024 [employer]  

## 2023-06-10 NOTE — Progress Notes (Unsigned)
Subjective:  Patient ID: Angel Kim, female    DOB: 14-Dec-1940  Age: 82 y.o. MRN: [id number]  CC: URI and Hypertension   HPI Angel Kim presents for f/up ----  Discussed the use of AI scribe software for clinical note transcription with the patient, who gave verbal consent to proceed.  History of Present Illness   The patient initially presented with dysuria, which resolved after increasing fluid intake. However, she has since been experiencing generalized fatigue. Shee also reports a recent episode of shoulder pain, diagnosed as bursitis and treated with an injection at an orthopedic clinic. The patient denies abdominal pain, blood in urine, kidney stone pain, vaginal discharge or bleeding, diarrhea, or constipation. She has had chills but no fever, and a scratchy throat with significant post-nasal drainage. He also mentions occasional irregular heartbeats but denies shortness of breath or chest pain. A recent COVID-19 test was negative. The patient is due to travel to [place] and expresses feelings of insecurity about his current health status.       Outpatient Medications Prior to Visit  Medication Sig Dispense Refill   aspirin EC 81 MG tablet Take 81 mg by mouth daily. Swallow whole.     atorvastatin (LIPITOR) 40 MG tablet TAKE 1 TABLET BY MOUTH EVERY DAY 90 tablet 0   estradiol (ESTRACE) 0.5 MG tablet TAKE 1 TABLET BY MOUTH EVERY DAY 90 tablet 0   Fexofenadine HCl (ALLEGRA ALLERGY PO) Take by mouth.     indapamide (LOZOL) 1.25 MG tablet Take 1 tablet (1.25 mg total) by mouth daily. 90 tablet 0   metoprolol tartrate (LOPRESSOR) 50 MG tablet TAKE 1 TABLET BY MOUTH TWICE A DAY 180 tablet 0   Multiple Vitamin (MULTIVITAMIN ADULT PO) Take 1 capsule by mouth daily.     NIACIN PO Take 1 capsule by mouth at bedtime.     olmesartan (BENICAR) 20 MG tablet Take 1 tablet (20 mg total) by mouth daily. 90 tablet 0   amLODipine (NORVASC) 5 MG tablet Take 1 tablet (5 mg total) by mouth daily. 90  tablet 0   No facility-administered medications prior to visit.    ROS Review of Systems  Constitutional:  Positive for fatigue. Negative for chills, diaphoresis and fever.  HENT:  Positive for postnasal drip, rhinorrhea, sinus pressure, sinus pain and sore throat. Negative for facial swelling and trouble swallowing.   Eyes: Negative.   Respiratory: Negative.  Negative for cough, shortness of breath and wheezing.   Cardiovascular:  Negative for chest pain, palpitations and leg swelling.  Gastrointestinal:  Negative for abdominal pain, constipation, diarrhea and vomiting.  Endocrine: Negative.   Genitourinary:  Positive for dysuria. Negative for hematuria.  Musculoskeletal:  Negative for arthralgias, joint swelling and myalgias.  Skin: Negative.  Negative for color change and rash.  Hematological:  Negative for adenopathy. Does not bruise/bleed easily.    Objective:  BP (!) 156/62 (BP Location: Right Arm, Patient Position: Sitting, Cuff Size: Large)   Pulse 75   Temp 97.8 F (36.6 C) (Oral)   Resp 16   Ht 5\' 5"  (1.651 m)   Wt 137 lb (62.1 kg)   SpO2 95%   BMI 22.80 kg/m   BP Readings from Last 3 Encounters:  06/10/23 (!) 156/62  04/29/23 134/76  11/27/22 (!) 142/74    Wt Readings from Last 3 Encounters:  06/10/23 137 lb (62.1 kg)  04/29/23 137 lb (62.1 kg)  11/27/22 139 lb (63 kg)    Physical Exam Vitals reviewed.  Constitutional:      General: She is not in acute distress.    Appearance: She is not ill-appearing, toxic-appearing or diaphoretic.  HENT:     Nose: Nose normal.     Mouth/Throat:     Mouth: Mucous membranes are moist.     Pharynx: Posterior oropharyngeal erythema present. No pharyngeal swelling or oropharyngeal exudate.     Tonsils: No tonsillar exudate or tonsillar abscesses.  Eyes:     General: No scleral icterus.    Conjunctiva/sclera: Conjunctivae normal.  Cardiovascular:     Rate and Rhythm: Normal rate and regular rhythm.     Heart sounds:  S1 normal and S2 normal. No murmur heard.    No friction rub. No gallop.     Comments: EKG- NSR, 69 bpm NS ST T wave changes - new No LVH or Q waves  Pulmonary:     Effort: Pulmonary effort is normal.     Breath sounds: No stridor. No wheezing, rhonchi or rales.  Abdominal:     General: Abdomen is flat.     Palpations: There is no mass.     Tenderness: There is no abdominal tenderness. There is no guarding.     Hernia: No hernia is present.  Musculoskeletal:     Right lower leg: No edema.     Left lower leg: No edema.  Skin:    General: Skin is warm and dry.     Coloration: Skin is pale.  Neurological:     General: No focal deficit present.     Mental Status: She is alert. Mental status is at baseline.  Psychiatric:        Mood and Affect: Mood normal.        Behavior: Behavior normal.     Lab Results  Component Value Date   WBC 12.4 (H) 06/10/2023   HGB 14.4 06/10/2023   HCT 44.0 06/10/2023   PLT 319.0 06/10/2023   GLUCOSE 94 04/29/2023   CHOL 194 04/29/2023   TRIG 223.0 (H) 04/29/2023   HDL 58.60 04/29/2023   LDLDIRECT 94.0 04/29/2023   LDLCALC 94 02/15/2022   ALT 24 04/29/2023   AST 22 04/29/2023   NA 140 04/29/2023   K 4.3 04/29/2023   CL 101 04/29/2023   CREATININE 0.92 04/29/2023   BUN 23 04/29/2023   CO2 30 04/29/2023   TSH 1.99 04/29/2023   HGBA1C 5.7 04/29/2023    MM 3D SCREENING MAMMOGRAM BILATERAL BREAST  Result Date: 01/30/2023 CLINICAL DATA:  Screening. EXAM: DIGITAL SCREENING BILATERAL MAMMOGRAM WITH TOMOSYNTHESIS AND CAD TECHNIQUE: Bilateral screening digital craniocaudal and mediolateral oblique mammograms were obtained. Bilateral screening digital breast tomosynthesis was performed. The images were evaluated with computer-aided detection. COMPARISON:  Previous exam(s). ACR Breast Density Category d: The breasts are extremely dense, which lowers the sensitivity of mammography. FINDINGS: There are no findings suspicious for malignancy. IMPRESSION:  No mammographic evidence of malignancy. A result letter of this screening mammogram will be mailed directly to the patient. RECOMMENDATION: Screening mammogram in one year. (Code:SM-B-01Y) BI-RADS CATEGORY  1: Negative. Electronically Signed   By: Emmaline Kluver M.D.   On: 01/30/2023 15:38    Assessment & Plan:   Dysuria -     Urinalysis, Routine w reflex microscopic; Future -     CULTURE, URINE COMPREHENSIVE; Future  Primary hypertension- BP is not well controlled. Will increase the CCB dose. -     EKG 12-Lead -     CBC with Differential/Platelet; Future -  amLODIPine Besylate; Take 1 tablet (10 mg total) by mouth daily.  Dispense: 90 tablet; Refill: 0  Fatigue due to excessive exertion, initial encounter -     EKG 12-Lead -     CBC with Differential/Platelet; Future -     Troponin I (High Sensitivity); Future  Abnormal electrocardiogram (ECG) (EKG) -     CBC with Differential/Platelet; Future -     Troponin I (High Sensitivity); Future  Acute recurrent pansinusitis -     Amoxicillin-Pot Clavulanate ER; Take 2 tablets by mouth 2 (two) times daily for 10 days.  Dispense: 40 tablet; Refill: 0  Stage 3b chronic kidney disease (HCC)     Follow-up: Return in about 3 months (around 09/10/2023).  Sanda Linger, MD

## 2023-06-11 MED ORDER — AMOXICILLIN-POT CLAVULANATE ER 1000-62.5 MG PO TB12
2.0000 | ORAL_TABLET | Freq: Two times a day (BID) | ORAL | 0 refills | Status: AC
Start: 2023-06-11 — End: 2023-06-21

## 2023-06-13 ENCOUNTER — Encounter: Payer: Self-pay | Admitting: Internal Medicine

## 2023-07-14 ENCOUNTER — Other Ambulatory Visit: Payer: Self-pay | Admitting: Internal Medicine

## 2023-07-14 DIAGNOSIS — R000 Tachycardia, unspecified: Secondary | ICD-10-CM

## 2023-07-14 DIAGNOSIS — I10 Essential (primary) hypertension: Secondary | ICD-10-CM

## 2023-07-24 ENCOUNTER — Other Ambulatory Visit: Payer: Self-pay | Admitting: Internal Medicine

## 2023-07-24 DIAGNOSIS — E785 Hyperlipidemia, unspecified: Secondary | ICD-10-CM

## 2023-08-01 ENCOUNTER — Other Ambulatory Visit: Payer: Self-pay | Admitting: Internal Medicine

## 2023-08-01 DIAGNOSIS — R000 Tachycardia, unspecified: Secondary | ICD-10-CM

## 2023-08-01 DIAGNOSIS — N952 Postmenopausal atrophic vaginitis: Secondary | ICD-10-CM

## 2023-08-01 DIAGNOSIS — I10 Essential (primary) hypertension: Secondary | ICD-10-CM

## 2023-08-01 MED ORDER — AMLODIPINE BESYLATE 10 MG PO TABS: 10.0000 mg | ORAL_TABLET | Freq: Every day | ORAL | 0 refills | Status: DC

## 2023-08-01 MED ORDER — OLMESARTAN MEDOXOMIL 20 MG PO TABS
20.0000 mg | ORAL_TABLET | Freq: Every day | ORAL | 0 refills | Status: DC
Start: 2023-08-01 — End: 2023-10-28

## 2023-08-01 MED ORDER — METOPROLOL TARTRATE 50 MG PO TABS
50.0000 mg | ORAL_TABLET | Freq: Two times a day (BID) | ORAL | 0 refills | Status: DC
Start: 2023-08-01 — End: 2024-01-27

## 2023-08-01 MED ORDER — INDAPAMIDE 1.25 MG PO TABS
1.2500 mg | ORAL_TABLET | Freq: Every day | ORAL | 0 refills | Status: DC
Start: 2023-08-01 — End: 2023-10-28

## 2023-08-01 MED ORDER — ESTRADIOL 0.5 MG PO TABS
0.5000 mg | ORAL_TABLET | Freq: Every day | ORAL | 0 refills | Status: DC
Start: 2023-08-01 — End: 2023-10-28

## 2023-08-29 ENCOUNTER — Encounter: Payer: Self-pay | Admitting: Internal Medicine

## 2023-09-02 ENCOUNTER — Ambulatory Visit (INDEPENDENT_AMBULATORY_CARE_PROVIDER_SITE_OTHER): Payer: Medicare Other

## 2023-09-02 VITALS — BP 156/62 | HR 75 | Temp 97.8°F | Ht 65.0 in | Wt 137.0 lb

## 2023-09-02 DIAGNOSIS — Z0000 Encounter for general adult medical examination without abnormal findings: Secondary | ICD-10-CM | POA: Diagnosis not present

## 2023-09-02 NOTE — Progress Notes (Signed)
Subjective:   Angel Kim is a 82 y.o. female who presents for Medicare Annual (Subsequent) preventive examination.  Visit Complete: Virtual I connected with  [name] on 09/02/23 by a audio enabled telemedicine application and verified that I am speaking with the correct person using two identifiers.  Patient Location: Home  Provider Location: Office/Clinic  I discussed the limitations of evaluation and management by telemedicine. The patient expressed understanding and agreed to proceed.  Vital Signs: Because this visit was a virtual/telehealth visit, some criteria may be missing or patient reported. Any vitals not documented were not able to be obtained and vitals that have been documented are patient reported.  Patient Medicare AWV questionnaire was completed by the patient on N/A; I have confirmed that all information answered by patient is correct and no changes since this date.  Cardiac Risk Factors include: advanced age (>30men, >32 women);dyslipidemia;hypertension;family history of premature cardiovascular disease     Objective:    Today's Vitals   09/02/23 1044  BP: (!) 156/62  Pulse: 75  Temp: 97.8 F (36.6 C)  SpO2: 95%  Weight: 137 lb (62.1 kg)  Height: 5\' 5"  (1.651 m)  PainSc: 2    Body mass index is 22.8 kg/m. Patient was unable to self-report due to a lack of equipment at home via telehealth, using last visit vitals     09/02/2023   10:50 AM 03/30/2022    8:48 AM 03/07/2022    4:07 PM  Advanced Directives  Does Patient Have a Medical Advance Directive? Yes Yes Yes  Type of Estate agent of [place];Living will Healthcare Power of [place];Living will Living will;Healthcare Power of Attorney  Does patient want to make changes to medical advance directive? No - Patient declined  No - Patient declined  Copy of Healthcare Power of Attorney in Chart?  No - copy requested No - copy requested    Current Medications  (verified) Outpatient Encounter Medications as of 09/02/2023  Medication Sig   amLODipine (NORVASC) 10 MG tablet Take 1 tablet (10 mg total) by mouth daily.   aspirin EC 81 MG tablet Take 81 mg by mouth daily. Swallow whole.   atorvastatin (LIPITOR) 40 MG tablet TAKE 1 TABLET BY MOUTH EVERY DAY   estradiol (ESTRACE) 0.5 MG tablet Take 1 tablet (0.5 mg total) by mouth daily.   Fexofenadine HCl (ALLEGRA ALLERGY PO) Take by mouth.   indapamide (LOZOL) 1.25 MG tablet Take 1 tablet (1.25 mg total) by mouth daily.   metoprolol tartrate (LOPRESSOR) 50 MG tablet Take 1 tablet (50 mg total) by mouth 2 (two) times daily.   Multiple Vitamin (MULTIVITAMIN ADULT PO) Take 1 capsule by mouth daily.   olmesartan (BENICAR) 20 MG tablet Take 1 tablet (20 mg total) by mouth daily.   [DISCONTINUED] NIACIN PO Take 1 capsule by mouth at bedtime.   No facility-administered encounter medications on file as of 09/02/2023.    Allergies (verified) Ciprofloxacin and Ofloxacin   History: Past Medical History:  Diagnosis Date   Arthritis    Chronic kidney disease    CKD Stg 3   GERD (gastroesophageal reflux disease)    History of hiatal hernia    History of kidney stones    Hypertension    Irregular heart rhythm    pt states has rapid rate at times, controlled with metoprolol   PONV (postoperative nausea and vomiting)    Past Surgical History:  Procedure Laterality Date   ABDOMINAL HYSTERECTOMY     APPENDECTOMY  2015   BREAST BIOPSY Right    BREAST EXCISIONAL BIOPSY Left    CYSTOSCOPY/URETEROSCOPY/HOLMIUM LASER/STENT PLACEMENT Bilateral 03/30/2022   Procedure: CYSTOSCOPY BILATERAL /URETEROSCOPY BILATERAL RETROGRADES Manning Charity LASER/STENT PLACEMENT;  Surgeon: Bjorn Pippin, MD;  Location: [facility];  Service: Urology;  Laterality: Bilateral;   REDUCTION MAMMAPLASTY     Family History  Problem Relation Age of Onset   Diabetes Mother    Heart disease Mother    Heart attack Mother     Diabetes Father    Heart disease Father    Heart attack Father    Heart attack Sister    Heart disease Sister    Coronary artery disease Sister    Kidney failure Sister    Breast cancer Sister        59s   Heart attack Sister    Cancer Sister        breast   Healthy Sister    Early death Brother    Social History   Socioeconomic History   Marital status: Widowed    Spouse name: Not on file   Number of children: Not on file   Years of education: Not on file   Highest education level: Associate degree: occupational, Scientist, product/process development, or vocational program  Occupational History   Not on file  Tobacco Use   Smoking status: Former    Types: Cigarettes   Smokeless tobacco: Never  Substance and Sexual Activity   Alcohol use: Yes    Comment: occasional   Drug use: Never   Sexual activity: Not Currently    Partners: Male  Other Topics Concern   Not on file  Social History Narrative   Not on file   Social Determinants of Health   Financial Resource Strain: Low Risk  (09/02/2023)   Overall Financial Resource Strain (CARDIA)    Difficulty of Paying Living Expenses: Not hard at all  Food Insecurity: No Food Insecurity (09/02/2023)   Hunger Vital Sign    Worried About Running Out of Food in the Last Year: Never true    Ran Out of Food in the Last Year: Never true  Transportation Needs: No Transportation Needs (09/02/2023)   PRAPARE - Administrator, Civil Service (Medical): No    Lack of Transportation (Non-Medical): No  Physical Activity: Insufficiently Active (09/02/2023)   Exercise Vital Sign    Days of Exercise per Week: 2 days    Minutes of Exercise per Session: 30 min  Stress: No Stress Concern Present (09/02/2023)   [employer] of Occupational Health - Occupational Stress Questionnaire    Feeling of Stress : Only a little  Social Connections: Moderately Integrated (09/02/2023)   Social Connection and Isolation Panel [NHANES]    Frequency of Communication  with Friends and Family: More than three times a week    Frequency of Social Gatherings with Friends and Family: More than three times a week    Attends Religious Services: More than 4 times per year    Active Member of [employer] or Organizations: Yes    Attends Banker Meetings: More than 4 times per year    Marital Status: Widowed    Tobacco Counseling Counseling given: Not Answered   Clinical Intake:  Pre-visit preparation completed: Yes  Pain : 0-10 Pain Score: 2  Pain Type: Chronic pain Pain Location: Hip Pain Orientation: Right Pain Onset: More than a month ago (6 months ago) Effect of Pain on Daily Activities: walking     BMI -  recorded: 22 Nutritional Status: BMI of 19-24  Normal Nutritional Risks: None Diabetes: No  How often do you need to have someone help you when you read instructions, pamphlets, or other written materials from your doctor or pharmacy?: 1 - Never What is the last grade level you completed in school?: 2 years after High School  Interpreter Needed?: No  Information entered by :: Elyse Jarvis, CMA   Activities of Daily Living    09/02/2023   10:50 AM 09/01/2023   12:58 PM  In your present state of health, do you have any difficulty performing the following activities:  Hearing? 0 0  Vision? 0 0  Difficulty concentrating or making decisions? 0 0  Walking or climbing stairs? 0 0  Dressing or bathing? 0 0  Doing errands, shopping? 0 0  Preparing Food and eating ? N N  Using the Toilet? N N  In the past six months, have you accidently leaked urine? Y Y  Comment coughing or sneezing- sometimes   Do you have problems with loss of bowel control? N N  Managing your Medications? N N  Managing your Finances? N N  Housekeeping or managing your Housekeeping? N N    Patient Care Team: Etta Grandchild, MD as PCP - General (Internal Medicine)  Indicate any recent Medical Services you may have received from other than [facility] providers  in the past year (date may be approximate).     Assessment:   This is a routine wellness examination for [place].  Hearing/Vision screen Patient denied any hearing difficulty. No hearing aids. Patient only wears reading glasses.    Goals Addressed             This Visit's Progress    Patient Stated       Patient declined goal at this time.        Depression Screen    09/02/2023   10:49 AM 11/27/2022   10:32 AM 08/27/2022    9:57 AM 03/07/2022    4:08 PM 12/28/2021    3:13 PM  PHQ 2/9 Scores  PHQ - 2 Score 0 0 0 0 4  PHQ- 9 Score   1      Fall Risk    09/02/2023   10:50 AM 09/01/2023   12:58 PM 11/27/2022   10:31 AM 03/07/2022    4:09 PM 03/06/2022    4:52 PM  Fall Risk   Falls in the past year? 1 1 1  0 0  Number falls in past yr: 0 0 1 0 0  Injury with Fall? 1 0 0 0 0  Comment   bruised and sore left hip    Risk for fall due to : No Fall Risks  No Fall Risks No Fall Risks   Follow up Falls evaluation completed   Falls evaluation completed     MEDICARE RISK AT HOME: Medicare Risk at Home Any stairs in or around the home?: Yes If so, are there any without handrails?: No Home free of loose throw rugs in walkways, pet beds, electrical cords, etc?: Yes Adequate lighting in your home to reduce risk of falls?: Yes Life alert?: Yes Use of a cane, walker or w/c?: No Grab bars in the bathroom?: No Shower chair or bench in shower?: Yes Elevated toilet seat or a handicapped toilet?: Yes  TIMED UP AND GO:  Was the test performed?  No    Cognitive Function:  Patient is cogitatively intact.      09/02/2023  10:51 AM 03/07/2022    4:20 PM  6CIT Screen  What Year? 0 points 0 points  What month? 0 points 0 points  What time? 0 points 0 points  Count back from 20 0 points 0 points  Months in reverse 0 points 0 points  Repeat phrase 0 points 0 points  Total Score 0 points 0 points    Immunizations Immunization History  Administered Date(s) Administered   Fluad  Quad(high Dose 65+) 08/12/2019, 07/20/2021, 08/27/2022   Influenza-Unspecified 07/30/2020   PFIZER Comirnaty(Gray Top)Covid-19 Tri-Sucrose Vaccine 06/07/2021   PFIZER(Purple Top)SARS-COV-2 Vaccination 11/23/2019, 12/14/2019, 07/30/2020, 06/07/2021, 10/26/2022   PNEUMOCOCCAL CONJUGATE-20 08/27/2022   Respiratory Syncytial Virus Vaccine,Recomb Aduvanted(Arexvy) 11/01/2022    TDAP status: Due, Education has been provided regarding the importance of this vaccine. Advised may receive this vaccine at local pharmacy or Health Dept. Aware to provide a copy of the vaccination record if obtained from local pharmacy or Health Dept. Verbalized acceptance and understanding.  Flu Vaccine status: Declined, Education has been provided regarding the importance of this vaccine but patient still declined. Advised may receive this vaccine at local pharmacy or Health Dept. Aware to provide a copy of the vaccination record if obtained from local pharmacy or Health Dept. Verbalized acceptance and understanding.  Pneumococcal vaccine status: Up to date  Covid-19 vaccine status: Completed vaccines  Qualifies for Shingles Vaccine? Yes   Zostavax completed No   Shingrix Completed?: No.    Education has been provided regarding the importance of this vaccine. Patient has been advised to call insurance company to determine out of pocket expense if they have not yet received this vaccine. Advised may also receive vaccine at local pharmacy or Health Dept. Verbalized acceptance and understanding.  Screening Tests Health Maintenance  Topic Date Due   DTaP/Tdap/Td (1 - Tdap) Never done   DEXA SCAN  Never done   COVID-19 Vaccine (7 - 2023-24 season) 09/18/2023 (Originally 06/30/2023)   Zoster Vaccines- Shingrix (1 of 2) 12/03/2023 (Originally 03/15/1993)   INFLUENZA VACCINE  01/27/2024 (Originally 05/30/2023)   Medicare Annual Wellness (AWV)  09/01/2024   Pneumonia Vaccine 70+ Years old  Completed   HPV VACCINES  Aged Out     Health Maintenance  Health Maintenance Due  Topic Date Due   DTaP/Tdap/Td (1 - Tdap) Never done   DEXA SCAN  Never done    Colorectal cancer screening: No longer required.   Mammogram status: No longer required due to age.  Bone Density status: due  Lung Cancer Screening: (Low Dose CT Chest recommended if Age 42-80 years, 20 pack-year currently smoking OR have quit w/in 15years.) does not qualify.   Lung Cancer Screening Referral: n/a  Additional Screening:  Hepatitis C Screening: does not qualify; Completed n/a  Vision Screening: Recommended annual ophthalmology exams for early detection of glaucoma and other disorders of the eye. Is the patient up to date with their annual eye exam?  Yes  Who is the provider or what is the name of the office in which the patient attends annual eye exams? Patient could not remember name but knows they are in [place] If pt is not established with a provider, would they like to be referred to a provider to establish care? No .   Dental Screening: Recommended annual dental exams for proper oral hygiene   Community Resource Referral / Chronic Care Management: CRR required this visit?  No   CCM required this visit?  No     Plan:  I have personally reviewed and noted the following in the patient's chart:   Medical and social history Use of alcohol, tobacco or illicit drugs  Current medications and supplements including opioid prescriptions. Patient is not currently taking opioid prescriptions. Functional ability and status Nutritional status Physical activity Advanced directives List of other physicians Hospitalizations, surgeries, and ER visits in previous 12 months Vitals Screenings to include cognitive, depression, and falls Referrals and appointments  In addition, I have reviewed and discussed with patient certain preventive protocols, quality metrics, and best practice recommendations. A written personalized care plan  for preventive services as well as general preventive health recommendations were provided to patient.     Marinus Maw, CMA   09/02/2023   After Visit Summary: (MyChart) Due to this being a telephonic visit, the after visit summary with patients personalized plan was offered to patient via MyChart   Nurse Notes: N/A

## 2023-09-03 ENCOUNTER — Encounter: Payer: Self-pay | Admitting: Internal Medicine

## 2023-09-03 ENCOUNTER — Ambulatory Visit: Payer: Medicare Other | Admitting: Internal Medicine

## 2023-09-03 ENCOUNTER — Other Ambulatory Visit: Payer: Self-pay | Admitting: Internal Medicine

## 2023-09-03 VITALS — BP 134/62 | HR 94 | Temp 97.7°F | Resp 16 | Ht 65.0 in | Wt 139.8 lb

## 2023-09-03 DIAGNOSIS — Z23 Encounter for immunization: Secondary | ICD-10-CM | POA: Diagnosis not present

## 2023-09-03 DIAGNOSIS — N1832 Chronic kidney disease, stage 3b: Secondary | ICD-10-CM

## 2023-09-03 DIAGNOSIS — E2839 Other primary ovarian failure: Secondary | ICD-10-CM | POA: Diagnosis not present

## 2023-09-03 DIAGNOSIS — I10 Essential (primary) hypertension: Secondary | ICD-10-CM

## 2023-09-03 DIAGNOSIS — Z1231 Encounter for screening mammogram for malignant neoplasm of breast: Secondary | ICD-10-CM

## 2023-09-03 DIAGNOSIS — D72829 Elevated white blood cell count, unspecified: Secondary | ICD-10-CM

## 2023-09-03 LAB — CBC WITH DIFFERENTIAL/PLATELET
Basophils Absolute: 0.1 10*3/uL (ref 0.0–0.1)
Basophils Relative: 0.8 % (ref 0.0–3.0)
Eosinophils Absolute: 0 10*3/uL (ref 0.0–0.7)
Eosinophils Relative: 0.3 % (ref 0.0–5.0)
HCT: 42.7 % (ref 36.0–46.0)
Hemoglobin: 14.3 g/dL (ref 12.0–15.0)
Lymphocytes Relative: 12.1 % (ref 12.0–46.0)
Lymphs Abs: 1.3 10*3/uL (ref 0.7–4.0)
MCHC: 33.5 g/dL (ref 30.0–36.0)
MCV: 86.4 fL (ref 78.0–100.0)
Monocytes Absolute: 0.1 10*3/uL (ref 0.1–1.0)
Monocytes Relative: 1.2 % — ABNORMAL LOW (ref 3.0–12.0)
Neutro Abs: 9.2 10*3/uL — ABNORMAL HIGH (ref 1.4–7.7)
Neutrophils Relative %: 85.6 % — ABNORMAL HIGH (ref 43.0–77.0)
Platelets: 276 10*3/uL (ref 150.0–400.0)
RBC: 4.94 Mil/uL (ref 3.87–5.11)
RDW: 12.7 % (ref 11.5–15.5)
WBC: 10.7 10*3/uL — ABNORMAL HIGH (ref 4.0–10.5)

## 2023-09-03 LAB — BASIC METABOLIC PANEL
BUN: 21 mg/dL (ref 6–23)
CO2: 30 meq/L (ref 19–32)
Calcium: 10.5 mg/dL (ref 8.4–10.5)
Chloride: 98 meq/L (ref 96–112)
Creatinine, Ser: 1.04 mg/dL (ref 0.40–1.20)
GFR: 50.78 mL/min — ABNORMAL LOW (ref >60.00)
Glucose, Bld: 162 mg/dL — ABNORMAL HIGH (ref 70–99)
Potassium: 4.1 meq/L (ref 3.5–5.1)
Sodium: 139 meq/L (ref 135–145)

## 2023-09-03 NOTE — Patient Instructions (Signed)
Hypertension, Adult High blood pressure (hypertension) is when the force of blood pumping through the arteries is too strong. The arteries are the blood vessels that carry blood from the heart throughout the body. Hypertension forces the heart to work harder to pump blood and may cause arteries to become narrow or stiff. Untreated or uncontrolled hypertension can lead to a heart attack, heart failure, a stroke, kidney disease, and other problems. A blood pressure reading consists of a higher number over a lower number. Ideally, your blood pressure should be below 120/80. The first ("top") number is called the systolic pressure. It is a measure of the pressure in your arteries as your heart beats. The second ("bottom") number is called the diastolic pressure. It is a measure of the pressure in your arteries as the heart relaxes. What are the causes? The exact cause of this condition is not known. There are some conditions that result in high blood pressure. What increases the risk? Certain factors may make you more likely to develop high blood pressure. Some of these risk factors are under your control, including: Smoking. Not getting enough exercise or physical activity. Being overweight. Having too much fat, sugar, calories, or salt (sodium) in your diet. Drinking too much alcohol. Other risk factors include: Having a personal history of heart disease, diabetes, high cholesterol, or kidney disease. Stress. Having a family history of high blood pressure and high cholesterol. Having obstructive sleep apnea. Age. The risk increases with age. What are the signs or symptoms? High blood pressure may not cause symptoms. Very high blood pressure (hypertensive crisis) may cause: Headache. Fast or irregular heartbeats (palpitations). Shortness of breath. Nosebleed. Nausea and vomiting. Vision changes. Severe chest pain, dizziness, and seizures. How is this diagnosed? This condition is diagnosed by  measuring your blood pressure while you are seated, with your arm resting on a flat surface, your legs uncrossed, and your feet flat on the floor. The cuff of the blood pressure monitor will be placed directly against the skin of your upper arm at the level of your heart. Blood pressure should be measured at least twice using the same arm. Certain conditions can cause a difference in blood pressure between your right and left arms. If you have a high blood pressure reading during one visit or you have normal blood pressure with other risk factors, you may be asked to: Return on a different day to have your blood pressure checked again. Monitor your blood pressure at home for 1 week or longer. If you are diagnosed with hypertension, you may have other blood or imaging tests to help your health care provider understand your overall risk for other conditions. How is this treated? This condition is treated by making healthy lifestyle changes, such as eating healthy foods, exercising more, and reducing your alcohol intake. You may be referred for counseling on a healthy diet and physical activity. Your health care provider may prescribe medicine if lifestyle changes are not enough to get your blood pressure under control and if: Your systolic blood pressure is above 130. Your diastolic blood pressure is above 80. Your personal target blood pressure may vary depending on your medical conditions, your age, and other factors. Follow these instructions at home: Eating and drinking  Eat a diet that is high in fiber and potassium, and low in sodium, added sugar, and fat. An example of this eating plan is called the DASH diet. DASH stands for Dietary Approaches to Stop Hypertension. To eat this way: Eat   plenty of fresh fruits and vegetables. Try to fill one half of your plate at each meal with fruits and vegetables. Eat whole grains, such as whole-wheat pasta, brown rice, or whole-grain bread. Fill about one  fourth of your plate with whole grains. Eat or drink low-fat dairy products, such as skim milk or low-fat yogurt. Avoid fatty cuts of meat, processed or cured meats, and poultry with skin. Fill about one fourth of your plate with lean proteins, such as fish, chicken without skin, beans, eggs, or tofu. Avoid pre-made and processed foods. These tend to be higher in sodium, added sugar, and fat. Reduce your daily sodium intake. Many people with hypertension should eat less than 1,500 mg of sodium a day. Do not drink alcohol if: Your health care provider tells you not to drink. You are pregnant, may be pregnant, or are planning to become pregnant. If you drink alcohol: Limit how much you have to: 0-1 drink a day for women. 0-2 drinks a day for men. Know how much alcohol is in your drink. In the [place] one drink equals one 12 oz bottle of beer (355 mL), one 5 oz glass of wine (148 mL), or one 1 oz glass of hard liquor (44 mL). Lifestyle  Work with your health care provider to maintain a healthy body weight or to lose weight. Ask what an ideal weight is for you. Get at least 30 minutes of exercise that causes your heart to beat faster (aerobic exercise) most days of the week. Activities may include walking, swimming, or biking. Include exercise to strengthen your muscles (resistance exercise), such as Pilates or lifting weights, as part of your weekly exercise routine. Try to do these types of exercises for 30 minutes at least 3 days a week. Do not use any products that contain nicotine or tobacco. These products include cigarettes, chewing tobacco, and vaping devices, such as e-cigarettes. If you need help quitting, ask your health care provider. Monitor your blood pressure at home as told by your health care provider. Keep all follow-up visits. This is important. Medicines Take over-the-counter and prescription medicines only as told by your health care provider. Follow directions carefully. Blood  pressure medicines must be taken as prescribed. Do not skip doses of blood pressure medicine. Doing this puts you at risk for problems and can make the medicine less effective. Ask your health care provider about side effects or reactions to medicines that you should watch for. Contact a health care provider if you: Think you are having a reaction to a medicine you are taking. Have headaches that keep coming back (recurring). Feel dizzy. Have swelling in your ankles. Have trouble with your vision. Get help right away if you: Develop a severe headache or confusion. Have unusual weakness or numbness. Feel faint. Have severe pain in your chest or abdomen. Vomit repeatedly. Have trouble breathing. These symptoms may be an emergency. Get help right away. Call 911. Do not wait to see if the symptoms will go away. Do not drive yourself to the hospital. Summary Hypertension is when the force of blood pumping through your arteries is too strong. If this condition is not controlled, it may put you at risk for serious complications. Your personal target blood pressure may vary depending on your medical conditions, your age, and other factors. For most people, a normal blood pressure is less than 120/80. Hypertension is treated with lifestyle changes, medicines, or a combination of both. Lifestyle changes include losing weight, eating a healthy,   low-sodium diet, exercising more, and limiting alcohol. This information is not intended to replace advice given to you by your health care provider. Make sure you discuss any questions you have with your health care provider. Document Revised: 08/22/2021 Document Reviewed: 08/22/2021 Elsevier Patient Education  2024 Else[employer] Inc.  

## 2023-09-03 NOTE — Progress Notes (Unsigned)
Subjective:  Patient ID: Angel Kim, female    DOB: Oct 10, 1941  Age: 82 y.o. MRN: [id number]  CC: Hypertension   HPI Virgia Kelner presents for f/up ----  Discussed the use of AI scribe software for clinical note transcription with the patient, who gave verbal consent to proceed.  History of Present Illness   The patient, with a history of kidney disease and a family history of heart disease, is active had intermittent DOE particularly during walking. This is described as minor and transient, with the last significant episode occurring approximately six months prior. The patient also reports frequent feelings of fatigue, which they attribute to their kidney disease.  The patient has a history of heart disease in their family, with two sisters undergoing bypass surgery in their fifties and both parents succumbing to heart attacks. They underwent cardiac catheterization approximately three years ago, which revealed no arterial issues.  The patient also experiences occasional dizziness and lightheadedness, which they attribute to age. The patient's white blood cell count was noted to be elevated during their last visit.       Outpatient Medications Prior to Visit  Medication Sig Dispense Refill   amLODipine (NORVASC) 10 MG tablet Take 1 tablet (10 mg total) by mouth daily. 90 tablet 0   aspirin EC 81 MG tablet Take 81 mg by mouth daily. Swallow whole.     atorvastatin (LIPITOR) 40 MG tablet TAKE 1 TABLET BY MOUTH EVERY DAY 90 tablet 0   estradiol (ESTRACE) 0.5 MG tablet Take 1 tablet (0.5 mg total) by mouth daily. 90 tablet 0   Fexofenadine HCl (ALLEGRA ALLERGY PO) Take by mouth.     indapamide (LOZOL) 1.25 MG tablet Take 1 tablet (1.25 mg total) by mouth daily. 90 tablet 0   metoprolol tartrate (LOPRESSOR) 50 MG tablet Take 1 tablet (50 mg total) by mouth 2 (two) times daily. 180 tablet 0   Multiple Vitamin (MULTIVITAMIN ADULT PO) Take 1 capsule by mouth daily.     olmesartan (BENICAR)  20 MG tablet Take 1 tablet (20 mg total) by mouth daily. 90 tablet 0   No facility-administered medications prior to visit.    ROS Review of Systems  Objective:  BP 134/62 (BP Location: Left Arm, Patient Position: Sitting, Cuff Size: Normal)   Pulse 94   Temp 97.7 F (36.5 C) (Oral)   Ht 5\' 5"  (1.651 m)   Wt 139 lb 12.8 oz (63.4 kg)   SpO2 94%   BMI 23.26 kg/m   BP Readings from Last 3 Encounters:  09/03/23 134/62  09/02/23 (!) 156/62  06/10/23 (!) 156/62    Wt Readings from Last 3 Encounters:  09/03/23 139 lb 12.8 oz (63.4 kg)  09/02/23 137 lb (62.1 kg)  06/10/23 137 lb (62.1 kg)    Physical Exam  Lab Results  Component Value Date   WBC 12.4 (H) 06/10/2023   HGB 14.4 06/10/2023   HCT 44.0 06/10/2023   PLT 319.0 06/10/2023   GLUCOSE 94 04/29/2023   CHOL 194 04/29/2023   TRIG 223.0 (H) 04/29/2023   HDL 58.60 04/29/2023   LDLDIRECT 94.0 04/29/2023   LDLCALC 94 02/15/2022   ALT 24 04/29/2023   AST 22 04/29/2023   NA 140 04/29/2023   K 4.3 04/29/2023   CL 101 04/29/2023   CREATININE 0.92 04/29/2023   BUN 23 04/29/2023   CO2 30 04/29/2023   TSH 1.99 04/29/2023   HGBA1C 5.7 04/29/2023    MM 3D SCREENING MAMMOGRAM BILATERAL BREAST  Result Date: 01/30/2023 CLINICAL DATA:  Screening. EXAM: DIGITAL SCREENING BILATERAL MAMMOGRAM WITH TOMOSYNTHESIS AND CAD TECHNIQUE: Bilateral screening digital craniocaudal and mediolateral oblique mammograms were obtained. Bilateral screening digital breast tomosynthesis was performed. The images were evaluated with computer-aided detection. COMPARISON:  Previous exam(s). ACR Breast Density Category d: The breasts are extremely dense, which lowers the sensitivity of mammography. FINDINGS: There are no findings suspicious for malignancy. IMPRESSION: No mammographic evidence of malignancy. A result letter of this screening mammogram will be mailed directly to the patient. RECOMMENDATION: Screening mammogram in one year. (Code:[id number])  BI-RADS CATEGORY  1: Negative. Electronically Signed   By: Emmaline Kluver M.D.   On: 01/30/2023 15:38    Assessment & Plan:  Need for immunization against influenza -     Flu Vaccine Trivalent High Dose (Fluad)  Estrogen deficiency -     DG Bone Density; Future  Primary hypertension -     Basic metabolic panel; Future -     CBC with Differential/Platelet; Future  Stage 3b chronic kidney disease (HCC) -     Basic metabolic panel; Future     Follow-up: Return in about 4 months (around 01/01/2024).  Sanda Linger, MD

## 2023-09-05 DIAGNOSIS — D72829 Elevated white blood cell count, unspecified: Secondary | ICD-10-CM | POA: Insufficient documentation

## 2023-09-10 ENCOUNTER — Ambulatory Visit: Payer: Medicare Other | Admitting: Internal Medicine

## 2023-09-19 ENCOUNTER — Telehealth: Payer: Medicare Other | Admitting: Emergency Medicine

## 2023-09-19 DIAGNOSIS — J329 Chronic sinusitis, unspecified: Secondary | ICD-10-CM | POA: Diagnosis not present

## 2023-09-19 MED ORDER — AMOXICILLIN-POT CLAVULANATE 875-125 MG PO TABS: 1.0000 | ORAL_TABLET | Freq: Two times a day (BID) | ORAL | 0 refills | Status: DC

## 2023-09-19 NOTE — Progress Notes (Signed)
E-Visit for Sinus Problems  We are sorry that you are not feeling well.  Here is how we plan to help!  Based on what you have shared with me it looks like you have sinusitis.  Sinusitis is inflammation and infection in the sinus cavities of the head.  Based on your presentation I believe you most likely have Acute Bacterial Sinusitis.  This is an infection caused by bacteria and is treated with antibiotics. I have prescribed Augmentin 875mg/125mg one tablet twice daily with food, for 7 days. You may use an oral decongestant such as Mucinex D or if you have glaucoma or high blood pressure use plain Mucinex. Saline nasal spray help and can safely be used as often as needed for congestion.  If you develop worsening sinus pain, fever or notice severe headache and vision changes, or if symptoms are not better after completion of antibiotic, please schedule an appointment with a health care provider.    Sinus infections are not as easily transmitted as other respiratory infection, however we still recommend that you avoid close contact with loved ones, especially the very young and elderly.  Remember to wash your hands thoroughly throughout the day as this is the number one way to prevent the spread of infection!  Home Care: Only take medications as instructed by your medical team. Complete the entire course of an antibiotic. Do not take these medications with alcohol. A steam or ultrasonic humidifier can help congestion.  You can place a towel over your head and breathe in the steam from hot water coming from a faucet. Avoid close contacts especially the very young and the elderly. Cover your mouth when you cough or sneeze. Always remember to wash your hands.  Get Help Right Away If: You develop worsening fever or sinus pain. You develop a severe head ache or visual changes. Your symptoms persist after you have completed your treatment plan.  Make sure you Understand these instructions. Will watch  your condition. Will get help right away if you are not doing well or get worse.  Thank you for choosing an e-visit.  Your e-visit answers were reviewed by a board certified advanced clinical practitioner to complete your personal care plan. Depending upon the condition, your plan could have included both over the counter or prescription medications.  Please review your pharmacy choice. Make sure the pharmacy is open so you can pick up prescription now. If there is a problem, you may contact your provider through MyChart messaging and have the prescription routed to another pharmacy.  Your safety is important to us. If you have drug allergies check your prescription carefully.   For the next 24 hours you can use MyChart to ask questions about today's visit, request a non-urgent call back, or ask for a work or school excuse. You will get an email in the next two days asking about your experience. I hope that your e-visit has been valuable and will speed your recovery.  Approximately 5 minutes was used in reviewing the patient's chart, questionnaire, prescribing medications, and documentation.  

## 2023-10-23 ENCOUNTER — Other Ambulatory Visit: Payer: Self-pay | Admitting: Internal Medicine

## 2023-10-23 DIAGNOSIS — E785 Hyperlipidemia, unspecified: Secondary | ICD-10-CM

## 2023-10-28 ENCOUNTER — Other Ambulatory Visit: Payer: Self-pay | Admitting: Internal Medicine

## 2023-10-28 DIAGNOSIS — N952 Postmenopausal atrophic vaginitis: Secondary | ICD-10-CM

## 2023-10-28 DIAGNOSIS — I10 Essential (primary) hypertension: Secondary | ICD-10-CM

## 2023-10-28 IMAGING — CT CT RENAL STONE PROTOCOL
1 of 2 series · 14 of 32 positions shown, 19 images · non-contrast
Comparison: None.

CLINICAL DATA: Patient with stage III B chronic kidney disease,
status post lithotripsy with stent placement in the past, presented
with hematuria. Status post hysterectomy.



[Series 2: renal standard/full · axial · 0.80mm/px · z∈[-463,-43]mm · 14 of 94 slices shown, 19 images]
[im 5/94  soft-tissue]
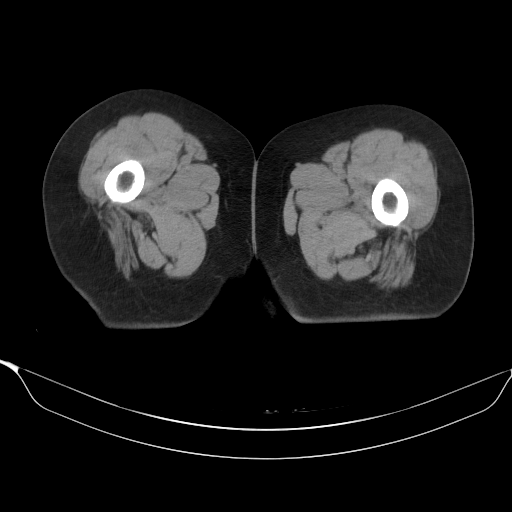
[im 5/94  bone]
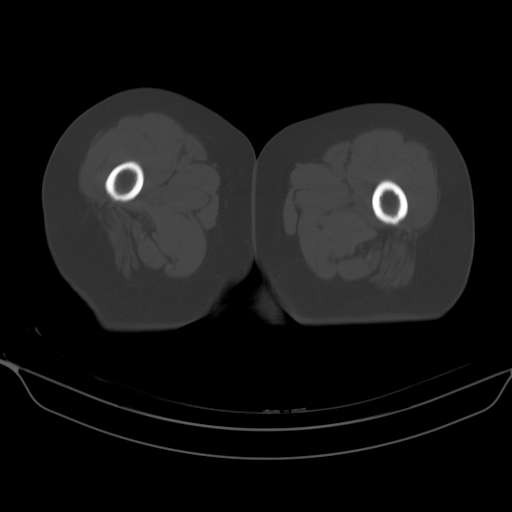
[im 15/94  soft-tissue]
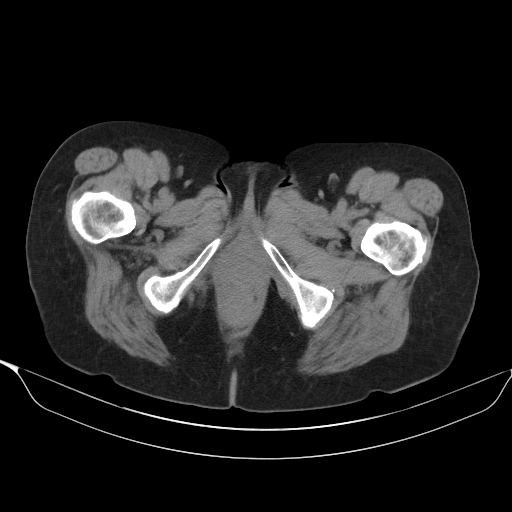
[im 20/94  soft-tissue]
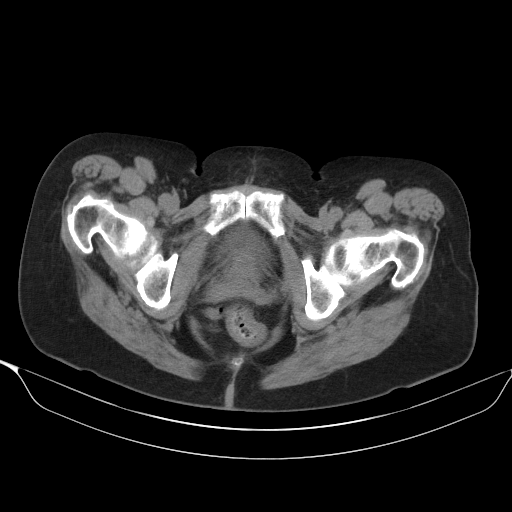
[im 25/94  soft-tissue]
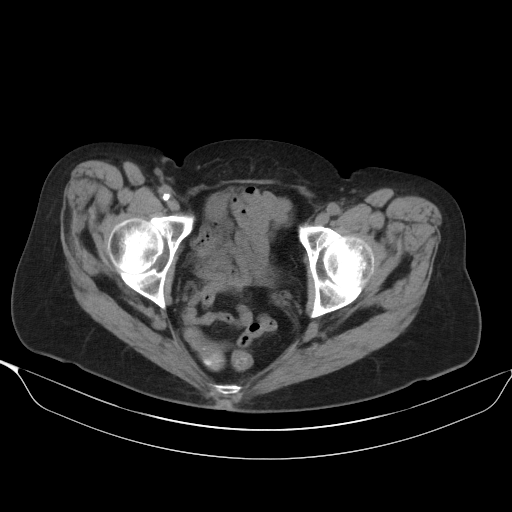
[im 35/94  soft-tissue]
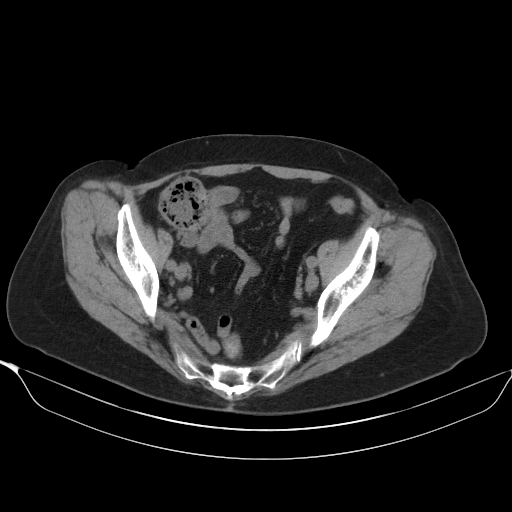
[im 40/94  soft-tissue]
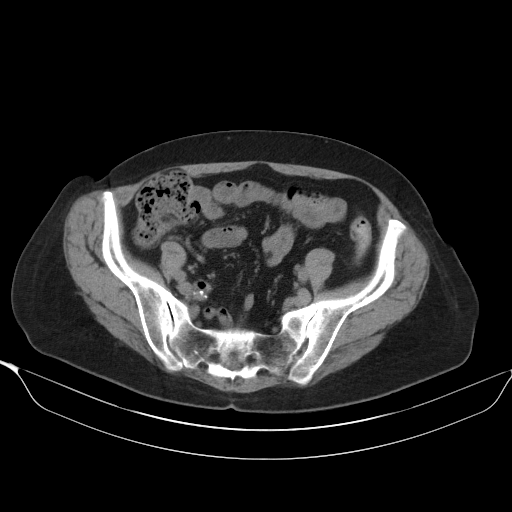
[im 49/94  soft-tissue]
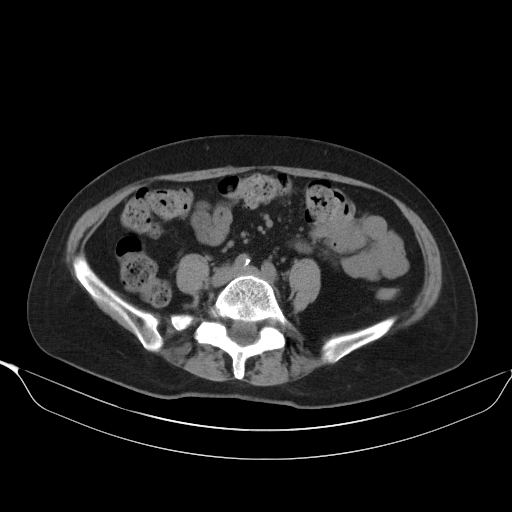
[im 54/94  soft-tissue]
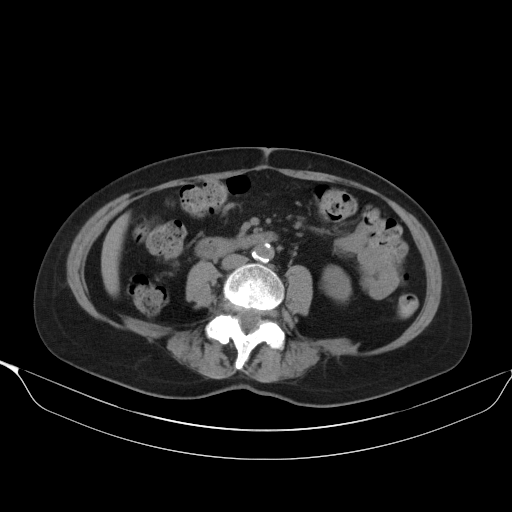
[im 59/94  soft-tissue]
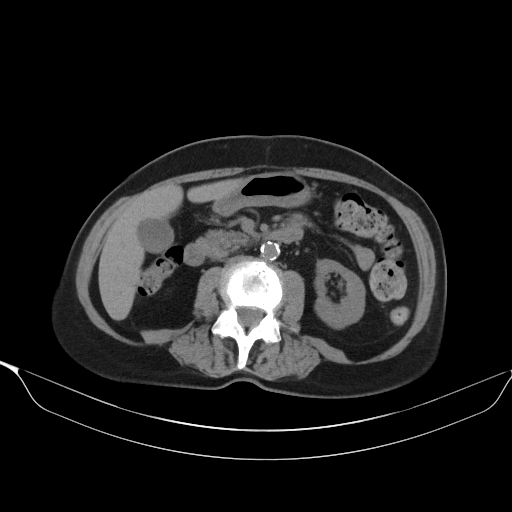
[im 59/94  bone]
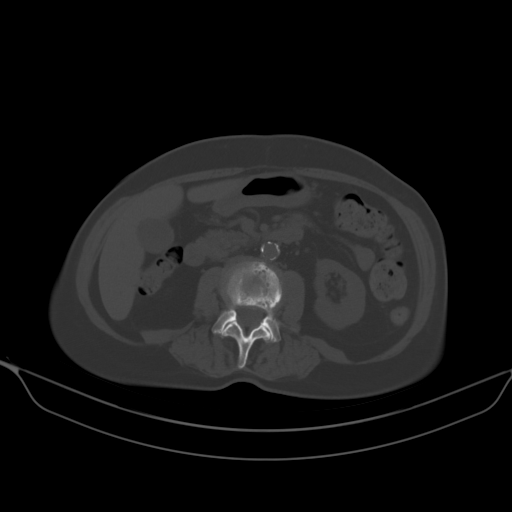
[im 69/94  soft-tissue]
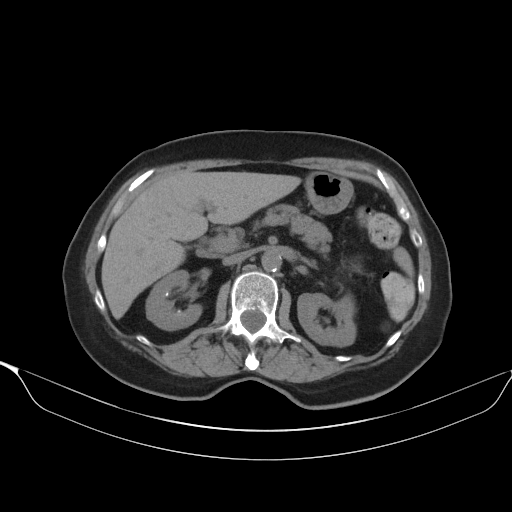
[im 74/94  soft-tissue]
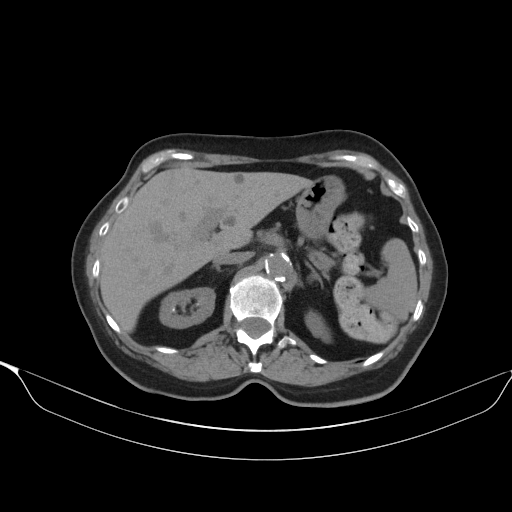
[im 74/94  lung]
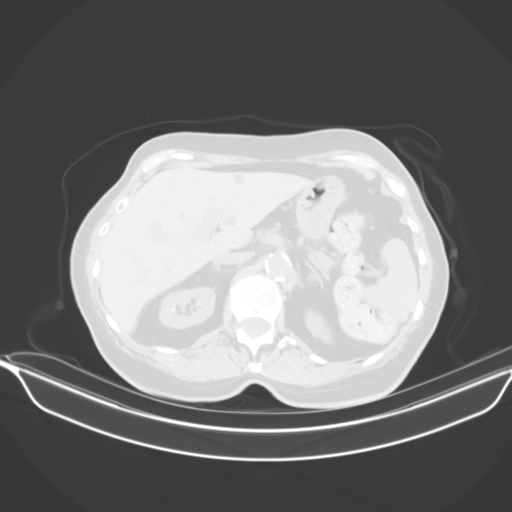
[im 79/94  soft-tissue]
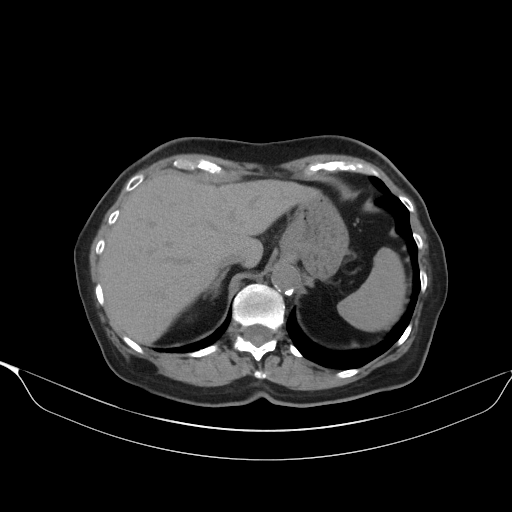
[im 79/94  lung]
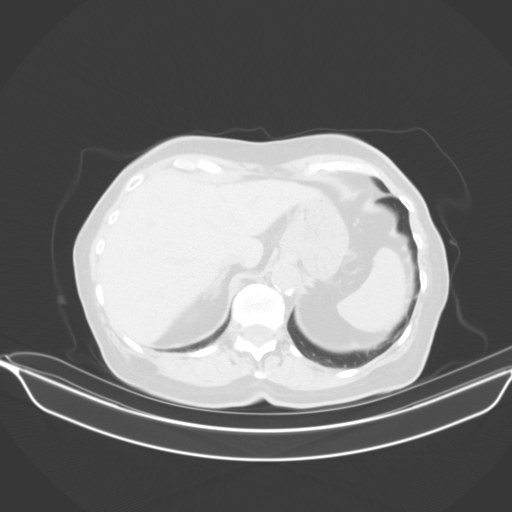
[im 84/94  lung]
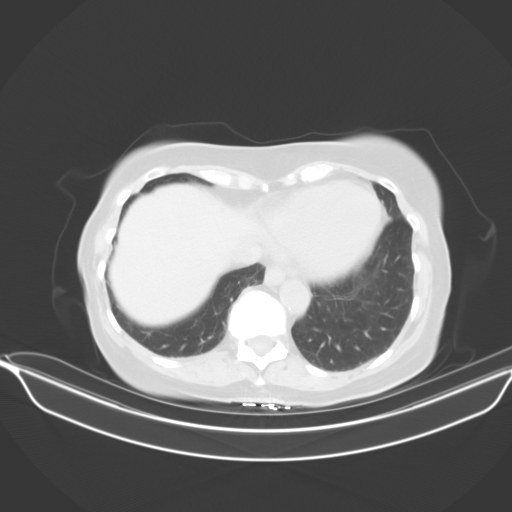
[im 89/94  soft-tissue]
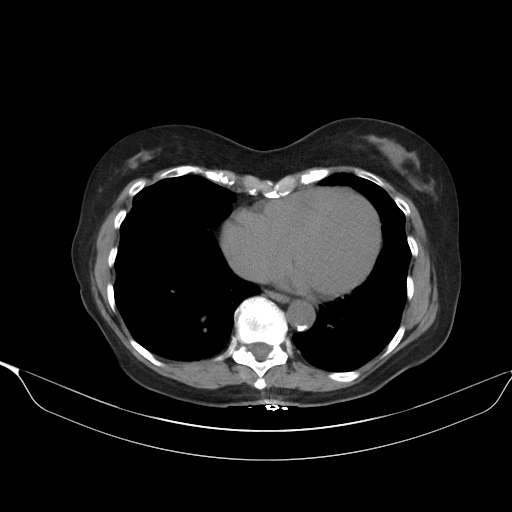
[im 89/94  lung]
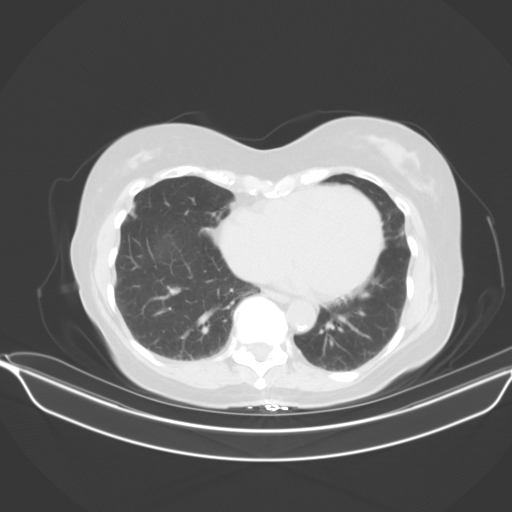

[14 of 32 positions shown; findings below may reference images not displayed]

FINDINGS: Lower chest: There is some scarring seen at the lingula and at the
left lung base.

Hepatobiliary: There are multiple smaller cysts seen in the liver.
No intrahepatic biliary dilatation.

Pancreas: Pancreas has a normal appearance.

Spleen: Is unremarkable

Adrenals/Urinary Tract: There is a calculus in the upper renal sinus
measuring 4.4 mm. In addition, there are multiple punctate 5 calculi
seen in the right kidney without hydronephrosis or hydroureter.

At the left side, there is 1 x 7.7 mm calculus seen at the PUJ
without hydronephrosis. Attenuation, there is a punctate calculus in
the lower renal sinus.

Stomach/Bowel: Small hiatal hernia. Bowel-gas pattern is
nonobstructive. Moderate stool in the colon.

Vascular/Lymphatic: Moderate atheromatous calcifications in the
abdominal aorta.

Reproductive: Status post hysterectomy.

Musculoskeletal: Mild spondylosis in the thoracolumbar spine.
IMPRESSION: 1. Multiple calculi in the right kidney with the largest calculus
measuring 4.4 mm at the upper renal sinus. No hydronephrosis or
hydroureter.

2. There is a 1 x 7.7 mm nonobstructing calculus seen at the left
PUJ

## 2023-11-08 ENCOUNTER — Encounter: Payer: Self-pay | Admitting: Internal Medicine

## 2023-11-18 ENCOUNTER — Telehealth: Payer: Medicare Other | Admitting: Physician Assistant

## 2023-11-18 DIAGNOSIS — B9689 Other specified bacterial agents as the cause of diseases classified elsewhere: Secondary | ICD-10-CM

## 2023-11-18 DIAGNOSIS — J208 Acute bronchitis due to other specified organisms: Secondary | ICD-10-CM | POA: Diagnosis not present

## 2023-11-18 MED ORDER — AZITHROMYCIN 250 MG PO TABS: ORAL_TABLET | ORAL | 0 refills | Status: AC

## 2023-11-18 MED ORDER — BENZONATATE 100 MG PO CAPS: 100.0000 mg | ORAL_CAPSULE | Freq: Three times a day (TID) | ORAL | 0 refills | Status: DC | PRN

## 2023-11-18 NOTE — Progress Notes (Signed)
Virtual Visit Consent   Angel Kim, you are scheduled for a virtual visit with a [facility] provider today. Just as with appointments in the office, your consent must be obtained to participate. Your consent will be active for this visit and any virtual visit you may have with one of our providers in the next 365 days. If you have a MyChart account, a copy of this consent can be sent to you electronically.  As this is a virtual visit, video technology does not allow for your provider to perform a traditional examination. This may limit your provider's ability to fully assess your condition. If your provider identifies any concerns that need to be evaluated in person or the need to arrange testing (such as labs, EKG, etc.), we will make arrangements to do so. Although advances in technology are sophisticated, we cannot ensure that it will always work on either your end or our end. If the connection with a video visit is poor, the visit may have to be switched to a telephone visit. With either a video or telephone visit, we are not always able to ensure that we have a secure connection.  By engaging in this virtual visit, you consent to the provision of healthcare and authorize for your insurance to be billed (if applicable) for the services provided during this visit. Depending on your insurance coverage, you may receive a charge related to this service.  I need to obtain your verbal consent now. Are you willing to proceed with your visit today? Angel Kim has provided verbal consent on 11/18/2023 for a virtual visit (video or telephone). Angel Loveless, PA-C  Date: 11/18/2023 8:12 AM  Virtual Visit via Video Note   I, Angel Kim, connected with  Angel Kim  ([id number], 07-Jul-1941) on 11/18/23 at  8:00 AM EST by a video-enabled telemedicine application and verified that I am speaking with the correct person using two identifiers.  Location: Patient: Virtual Visit Location Patient:  Home Provider: Virtual Visit Location Provider: Home Office   I discussed the limitations of evaluation and management by telemedicine and the availability of in person appointments. The patient expressed understanding and agreed to proceed.    History of Present Illness: Angel Kim is a 83 y.o. who identifies as a female who was assigned female at birth, and is being seen today for flu-like illness.  HPI: URI  This is a new problem. The current episode started in the past 7 days (Started Friday, 11/15/23. Was on a Cruise and last day of cruise symptoms started with dry, hacking cough. Returned Saturday.). The problem has been gradually improving. The maximum temperature recorded prior to her arrival was 100.4 - 100.9 F (100.8). The fever has been present for Less than 1 day. Associated symptoms include chest pain (mild burning yesterday), congestion, coughing (mildly productive), headaches, nausea (mild), a plugged ear sensation (right) and a sore throat. Pertinent negatives include no diarrhea, ear pain, rhinorrhea, sinus pain, vomiting or wheezing. Associated symptoms comments: myalgias. She has tried acetaminophen and antihistamine (benadryl, sudafed) for the symptoms. The treatment provided no relief.   Negative at home Covid 19 testing   Problems:  Patient Active Problem List   Diagnosis Date Noted   Leukocytosis 09/05/2023   Estrogen deficiency 09/03/2023   Need for immunization against influenza 09/03/2023   Abnormal electrocardiogram (ECG) (EKG) 06/10/2023   Multiple kidney stones 02/19/2022   Hyperlipidemia LDL goal <100 02/15/2022   Primary osteoarthritis of first carpometacarpal joint of left hand  02/15/2022   Stage 3b chronic kidney disease (HCC) 02/15/2022   Primary hypertension 12/28/2021    Allergies:  Allergies  Allergen Reactions   Ciprofloxacin Hives, Itching and Rash   Ofloxacin Hives, Itching and Rash   Medications:  Current Outpatient Medications:     azithromycin (ZITHROMAX) 250 MG tablet, Take 2 tablets on day 1, then 1 tablet daily on days 2 through 5, Disp: 6 tablet, Rfl: 0   benzonatate (TESSALON) 100 MG capsule, Take 1-2 capsules (100-200 mg total) by mouth 3 (three) times daily as needed., Disp: 30 capsule, Rfl: 0   amLODipine (NORVASC) 10 MG tablet, Take 1 tablet (10 mg total) by mouth daily., Disp: 90 tablet, Rfl: 0   amoxicillin-clavulanate (AUGMENTIN) 875-125 MG tablet, Take 1 tablet by mouth every 12 (twelve) hours., Disp: 14 tablet, Rfl: 0   aspirin EC 81 MG tablet, Take 81 mg by mouth daily. Swallow whole., Disp: , Rfl:    atorvastatin (LIPITOR) 40 MG tablet, TAKE 1 TABLET BY MOUTH EVERY DAY, Disp: 90 tablet, Rfl: 1   estradiol (ESTRACE) 0.5 MG tablet, TAKE 1 TABLET BY MOUTH EVERY DAY, Disp: 90 tablet, Rfl: 0   Fexofenadine HCl (ALLEGRA ALLERGY PO), Take by mouth., Disp: , Rfl:    indapamide (LOZOL) 1.25 MG tablet, TAKE 1 TABLET BY MOUTH DAILY., Disp: 90 tablet, Rfl: 1   metoprolol tartrate (LOPRESSOR) 50 MG tablet, Take 1 tablet (50 mg total) by mouth 2 (two) times daily., Disp: 180 tablet, Rfl: 0   Multiple Vitamin (MULTIVITAMIN ADULT PO), Take 1 capsule by mouth daily., Disp: , Rfl:    olmesartan (BENICAR) 20 MG tablet, TAKE 1 TABLET BY MOUTH EVERY DAY, Disp: 90 tablet, Rfl: 1  Observations/Objective: Patient is well-developed, well-nourished in no acute distress.  Resting comfortably at home.  Head is normocephalic, atraumatic.  No labored breathing.  Speech is clear and coherent with logical content.  Patient is alert and oriented at baseline.    Assessment and Plan: 1. Acute bacterial bronchitis (Primary) - azithromycin (ZITHROMAX) 250 MG tablet; Take 2 tablets on day 1, then 1 tablet daily on days 2 through 5  Dispense: 6 tablet; Refill: 0 - benzonatate (TESSALON) 100 MG capsule; Take 1-2 capsules (100-200 mg total) by mouth 3 (three) times daily as needed.  Dispense: 30 capsule; Refill: 0  - Worsening over a week  despite OTC medications - Will treat with Z-pack and tessalon perles - Can continue Mucinex  - Push fluids.  - Rest.  - Steam and humidifier can help - Seek in person evaluation if worsening or symptoms fail to improve    Follow Up Instructions: I discussed the assessment and treatment plan with the patient. The patient was provided an opportunity to ask questions and all were answered. The patient agreed with the plan and demonstrated an understanding of the instructions.  A copy of instructions were sent to the patient via MyChart unless otherwise noted below.    The patient was advised to call back or seek an in-person evaluation if the symptoms worsen or if the condition fails to improve as anticipated.    Angel Loveless, PA-C

## 2023-11-18 NOTE — Patient Instructions (Signed)
Angel Kim, thank you for joining [name], PA-C for today's virtual visit.  While this provider is not your primary care provider (PCP), if your PCP is located in our provider database this encounter information will be shared with them immediately following your visit.   A [facility] MyChart account gives you access to today's visit and all your visits, tests, and labs performed at [facility] " click here if you don't have a [facility] MyChart account or go to [email or web address]  Consent: (Patient) Angel Kim provided verbal consent for this virtual visit at the beginning of the encounter.  Current Medications:  Current Outpatient Medications:    azithromycin (ZITHROMAX) 250 MG tablet, Take 2 tablets on day 1, then 1 tablet daily on days 2 through 5, Disp: 6 tablet, Rfl: 0   benzonatate (TESSALON) 100 MG capsule, Take 1-2 capsules (100-200 mg total) by mouth 3 (three) times daily as needed., Disp: 30 capsule, Rfl: 0   amLODipine (NORVASC) 10 MG tablet, Take 1 tablet (10 mg total) by mouth daily., Disp: 90 tablet, Rfl: 0   amoxicillin-clavulanate (AUGMENTIN) 875-125 MG tablet, Take 1 tablet by mouth every 12 (twelve) hours., Disp: 14 tablet, Rfl: 0   aspirin EC 81 MG tablet, Take 81 mg by mouth daily. Swallow whole., Disp: , Rfl:    atorvastatin (LIPITOR) 40 MG tablet, TAKE 1 TABLET BY MOUTH EVERY DAY, Disp: 90 tablet, Rfl: 1   estradiol (ESTRACE) 0.5 MG tablet, TAKE 1 TABLET BY MOUTH EVERY DAY, Disp: 90 tablet, Rfl: 0   Fexofenadine HCl (ALLEGRA ALLERGY PO), Take by mouth., Disp: , Rfl:    indapamide (LOZOL) 1.25 MG tablet, TAKE 1 TABLET BY MOUTH DAILY., Disp: 90 tablet, Rfl: 1   metoprolol tartrate (LOPRESSOR) 50 MG tablet, Take 1 tablet (50 mg total) by mouth 2 (two) times daily., Disp: 180 tablet, Rfl: 0   Multiple Vitamin (MULTIVITAMIN ADULT PO), Take 1 capsule by mouth daily., Disp: , Rfl:    olmesartan (BENICAR) 20 MG tablet, TAKE 1 TABLET BY MOUTH  EVERY DAY, Disp: 90 tablet, Rfl: 1   Medications ordered in this encounter:  Meds ordered this encounter  Medications   azithromycin (ZITHROMAX) 250 MG tablet    Sig: Take 2 tablets on day 1, then 1 tablet daily on days 2 through 5    Dispense:  6 tablet    Refill:  0    Supervising Provider:   Merrilee Jansky [[id number]]   benzonatate (TESSALON) 100 MG capsule    Sig: Take 1-2 capsules (100-200 mg total) by mouth 3 (three) times daily as needed.    Dispense:  30 capsule    Refill:  0    Supervising Provider:   Merrilee Jansky [[id number]]     *If you need refills on other medications prior to your next appointment, please contact your pharmacy*  Follow-Up: Call back or seek an in-person evaluation if the symptoms worsen or if the condition fails to improve as anticipated.  [facility] [phone]  Other Instructions Acute Bronchitis, Adult  Acute bronchitis is sudden inflammation of the main airways (bronchi) that come off the windpipe (trachea) in the lungs. The swelling causes the airways to get smaller and make more mucus than normal. This can make it hard to breathe and can cause coughing or noisy breathing (wheezing). Acute bronchitis may last several weeks. The cough may last longer. Allergies, asthma, and exposure to smoke may make the condition worse. What are the  causes? This condition can be caused by germs and by substances that irritate the lungs, including: Cold and flu viruses. The most common cause of this condition is the virus that causes the common cold. Bacteria. This is less common. Breathing in substances that irritate the lungs, including: Smoke from cigarettes and other forms of tobacco. Dust and pollen. Fumes from household cleaning products, gases, or burned fuel. Indoor or outdoor air pollution. What increases the risk? The following factors may make you more likely to develop this condition: A weak body's defense system, also called  the immune system. A condition that affects your lungs and breathing, such as asthma. What are the signs or symptoms? Common symptoms of this condition include: Coughing. This may bring up clear, yellow, or green mucus from your lungs (sputum). Wheezing. Runny or stuffy nose. Having too much mucus in your lungs (chest congestion). Shortness of breath. Aches and pains, including sore throat or chest. How is this diagnosed? This condition is usually diagnosed based on: Your symptoms and medical history. A physical exam. You may also have other tests, including tests to rule out other conditions, such as pneumonia. These tests include: A test of lung function. Test of a mucus sample to look for the presence of bacteria. Tests to check the oxygen level in your blood. Blood tests. Chest X-ray. How is this treated? Most cases of acute bronchitis clear up over time without treatment. Your health care provider may recommend: Drinking more fluids to help thin your mucus so it is easier to cough up. Taking inhaled medicine (inhaler) to improve air flow in and out of your lungs. Using a vaporizer or a humidifier. These are machines that add water to the air to help you breathe better. Taking a medicine that thins mucus and clears congestion (expectorant). Taking a medicine that prevents or stops coughing (cough suppressant). It is not common to take an antibiotic medicine for this condition. Follow these instructions at home:  Take over-the-counter and prescription medicines only as told by your health care provider. Use an inhaler, vaporizer, or humidifier as told by your health care provider. Take two teaspoons (10 mL) of honey at bedtime to lessen coughing at night. Drink enough fluid to keep your urine pale yellow. Do not use any products that contain nicotine or tobacco. These products include cigarettes, chewing tobacco, and vaping devices, such as e-cigarettes. If you need help  quitting, ask your health care provider. Get plenty of rest. Return to your normal activities as told by your health care provider. Ask your health care provider what activities are safe for you. Keep all follow-up visits. This is important. How is this prevented? To lower your risk of getting this condition again: Wash your hands often with soap and water for at least 20 seconds. If soap and water are not available, use hand sanitizer. Avoid contact with people who have cold symptoms. Try not to touch your mouth, nose, or eyes with your hands. Avoid breathing in smoke or chemical fumes. Breathing smoke or chemical fumes will make your condition worse. Get the flu shot every year. Contact a health care provider if: Your symptoms do not improve after 2 weeks. You have trouble coughing up the mucus. Your cough keeps you awake at night. You have a fever. Get help right away if you: Cough up blood. Feel pain in your chest. Have severe shortness of breath. Faint or keep feeling like you are going to faint. Have a severe headache. Have a  fever or chills that get worse. These symptoms may represent a serious problem that is an emergency. Do not wait to see if the symptoms will go away. Get medical help right away. Call your local emergency services (911 in the [place] Do not drive yourself to the hospital. Summary Acute bronchitis is inflammation of the main airways (bronchi) that come off the windpipe (trachea) in the lungs. The swelling causes the airways to get smaller and make more mucus than normal. Drinking more fluids can help thin your mucus so it is easier to cough up. Take over-the-counter and prescription medicines only as told by your health care provider. Do not use any products that contain nicotine or tobacco. These products include cigarettes, chewing tobacco, and vaping devices, such as e-cigarettes. If you need help quitting, ask your health care provider. Contact a health care  provider if your symptoms do not improve after 2 weeks. This information is not intended to replace advice given to you by your health care provider. Make sure you discuss any questions you have with your health care provider. Document Revised: 01/25/2022 Document Reviewed: 02/15/2021 Elsevier Patient Education  2024 [employer]   If you have been instructed to have an in-person evaluation today at a local Urgent Care facility, please use the link below. It will take you to a list of all of our available [facility], including address, phone number and hours of operation. Please do not delay care.  [facility]  If you or a family member do not have a primary care provider, use the link below to schedule a visit and establish care. When you choose a [facility] primary care physician or advanced practice provider, you gain a long-term partner in health. Find a Primary Care Provider  Learn more about [facility]'s in-office and virtual care options: [facility] Get Care Now

## 2023-12-10 ENCOUNTER — Other Ambulatory Visit: Payer: Self-pay | Admitting: Internal Medicine

## 2023-12-10 DIAGNOSIS — I10 Essential (primary) hypertension: Secondary | ICD-10-CM

## 2023-12-16 ENCOUNTER — Encounter: Payer: Self-pay | Admitting: Adult Health

## 2023-12-17 ENCOUNTER — Ambulatory Visit (INDEPENDENT_AMBULATORY_CARE_PROVIDER_SITE_OTHER): Payer: Medicare Other | Admitting: Internal Medicine

## 2023-12-17 ENCOUNTER — Encounter: Payer: Self-pay | Admitting: Internal Medicine

## 2023-12-17 VITALS — BP 126/60 | HR 66 | Temp 97.4°F | Ht 65.0 in | Wt 140.2 lb

## 2023-12-17 DIAGNOSIS — M150 Primary generalized (osteo)arthritis: Secondary | ICD-10-CM | POA: Diagnosis not present

## 2023-12-17 DIAGNOSIS — N1832 Chronic kidney disease, stage 3b: Secondary | ICD-10-CM | POA: Diagnosis not present

## 2023-12-17 DIAGNOSIS — I10 Essential (primary) hypertension: Secondary | ICD-10-CM | POA: Diagnosis not present

## 2023-12-17 LAB — CBC WITH DIFFERENTIAL/PLATELET
Basophils Absolute: 0.1 10*3/uL (ref 0.0–0.1)
Basophils Relative: 1.3 % (ref 0.0–3.0)
Eosinophils Absolute: 0.5 10*3/uL (ref 0.0–0.7)
Eosinophils Relative: 5.7 % — ABNORMAL HIGH (ref 0.0–5.0)
HCT: 39.7 % (ref 36.0–46.0)
Hemoglobin: 13.5 g/dL (ref 12.0–15.0)
Lymphocytes Relative: 27.8 % (ref 12.0–46.0)
Lymphs Abs: 2.3 10*3/uL (ref 0.7–4.0)
MCHC: 34 g/dL (ref 30.0–36.0)
MCV: 86.5 fL (ref 78.0–100.0)
Monocytes Absolute: 0.7 10*3/uL (ref 0.1–1.0)
Monocytes Relative: 8.3 % (ref 3.0–12.0)
Neutro Abs: 4.7 10*3/uL (ref 1.4–7.7)
Neutrophils Relative %: 56.9 % (ref 43.0–77.0)
Platelets: 251 10*3/uL (ref 150.0–400.0)
RBC: 4.59 Mil/uL (ref 3.87–5.11)
RDW: 13.6 % (ref 11.5–15.5)
WBC: 8.3 10*3/uL (ref 4.0–10.5)

## 2023-12-17 LAB — BASIC METABOLIC PANEL
BUN: 20 mg/dL (ref 6–23)
CO2: 31 meq/L (ref 19–32)
Calcium: 10.2 mg/dL (ref 8.4–10.5)
Chloride: 101 meq/L (ref 96–112)
Creatinine, Ser: 0.75 mg/dL (ref 0.40–1.20)
GFR: 75.01 mL/min (ref >60.00)
Glucose, Bld: 98 mg/dL (ref 70–99)
Potassium: 4 meq/L (ref 3.5–5.1)
Sodium: 140 meq/L (ref 135–145)

## 2023-12-17 MED ORDER — TRAMADOL HCL 50 MG PO TABS: 50.0000 mg | ORAL_TABLET | Freq: Two times a day (BID) | ORAL | 1 refills | Status: DC | PRN

## 2023-12-17 NOTE — Progress Notes (Signed)
Subjective:  Patient ID: Angel Kim, female    DOB: 1941/08/20  Age: 83 y.o. MRN: [id number]  CC: Hypertension and Osteoarthritis   HPI Angel Kim presents for f/up -----  Discussed the use of AI scribe software for clinical note transcription with the patient, who gave verbal consent to proceed.  History of Present Illness   Angel Kim is an 83 year old female with arthritis and kidney stones who presents with worsening joint pain and recent kidney stone passage.  She experiences worsening arthritis pain, particularly in her hips and hands. The pain is severe and persistent, significantly affecting her daily activities. She is seeking pain management options that would not interfere with her kidney condition. For pain management, she uses Tylenol and has Tylenol 3 available but has not used it.  She recently visited her urologist and was found to have small stones in her right kidney, passing a stone a week ago Monday. A CT scan revealed a cyst with fluid and a stone in her left kidney. She experiences occasional flank pain and had blood in her urine during a recent ultrasound, indicating a stone was on its way down.  She has noticed significant swelling in her ankles, sometimes extending to her knees. The swelling reduces mostly at night, and she uses compression socks to manage it. She takes amlodipine for blood pressure, which has been well-controlled.  No dizziness, lightheadedness, chest pain, shortness of breath, or stomach symptoms. She experiences occasional headaches, which she attributes to allergies.       Outpatient Medications Prior to Visit  Medication Sig Dispense Refill   aspirin EC 81 MG tablet Take 81 mg by mouth daily. Swallow whole.     atorvastatin (LIPITOR) 40 MG tablet TAKE 1 TABLET BY MOUTH EVERY DAY 90 tablet 1   estradiol (ESTRACE) 0.5 MG tablet TAKE 1 TABLET BY MOUTH EVERY DAY 90 tablet 0   Fexofenadine HCl (ALLEGRA ALLERGY PO) Take by mouth.      indapamide (LOZOL) 1.25 MG tablet TAKE 1 TABLET BY MOUTH DAILY. 90 tablet 1   metoprolol tartrate (LOPRESSOR) 50 MG tablet Take 1 tablet (50 mg total) by mouth 2 (two) times daily. 180 tablet 0   Multiple Vitamin (MULTIVITAMIN ADULT PO) Take 1 capsule by mouth daily.     olmesartan (BENICAR) 20 MG tablet TAKE 1 TABLET BY MOUTH EVERY DAY 90 tablet 1   amLODipine (NORVASC) 10 MG tablet TAKE 1 TABLET BY MOUTH EVERY DAY 90 tablet 0   amoxicillin-clavulanate (AUGMENTIN) 875-125 MG tablet Take 1 tablet by mouth every 12 (twelve) hours. 14 tablet 0   benzonatate (TESSALON) 100 MG capsule Take 1-2 capsules (100-200 mg total) by mouth 3 (three) times daily as needed. 30 capsule 0   No facility-administered medications prior to visit.    ROS Review of Systems  Constitutional: Negative.  Negative for diaphoresis, fatigue and unexpected weight change.  HENT: Negative.    Eyes: Negative.   Respiratory: Negative.  Negative for cough, chest tightness, shortness of breath and wheezing.   Cardiovascular:  Positive for leg swelling. Negative for chest pain and palpitations.  Gastrointestinal:  Negative for abdominal pain, constipation, diarrhea, nausea and vomiting.  Genitourinary: Negative.  Negative for difficulty urinating.  Musculoskeletal:  Positive for arthralgias. Negative for myalgias.  Skin: Negative.   Neurological:  Negative for dizziness, weakness and headaches.  Hematological:  Negative for adenopathy. Does not bruise/bleed easily.  Psychiatric/Behavioral: Negative.      Objective:  BP 126/60 (BP Location:  Left Arm, Patient Position: Sitting, Cuff Size: Small)   Pulse 66   Temp (!) 97.4 F (36.3 C) (Oral)   Ht 5\' 5"  (1.651 m)   Wt 140 lb 3.2 oz (63.6 kg)   SpO2 94%   BMI 23.33 kg/m   BP Readings from Last 3 Encounters:  12/17/23 126/60  09/03/23 134/62  09/02/23 (!) 156/62    Wt Readings from Last 3 Encounters:  12/17/23 140 lb 3.2 oz (63.6 kg)  09/03/23 139 lb 12.8 oz (63.4  kg)  09/02/23 137 lb (62.1 kg)    Physical Exam Vitals reviewed.  Constitutional:      Appearance: Normal appearance.  HENT:     Mouth/Throat:     Mouth: Mucous membranes are moist.  Eyes:     General: No scleral icterus.    Conjunctiva/sclera: Conjunctivae normal.  Cardiovascular:     Rate and Rhythm: Normal rate and regular rhythm.     Heart sounds: No murmur heard.    No friction rub. No gallop.     Comments: EKG-- NSR, 64 bpm No LVH, Q waves, or ST/T wave changes  Unchanged  Pulmonary:     Effort: Pulmonary effort is normal.     Breath sounds: No stridor. No wheezing, rhonchi or rales.  Abdominal:     General: Abdomen is flat.     Palpations: There is no mass.     Tenderness: There is no abdominal tenderness. There is no guarding.     Hernia: No hernia is present.  Musculoskeletal:     Cervical back: Neck supple.     Right lower leg: No edema.     Left lower leg: No edema.  Lymphadenopathy:     Cervical: No cervical adenopathy.  Neurological:     General: No focal deficit present.     Mental Status: She is alert. Mental status is at baseline.  Psychiatric:        Mood and Affect: Mood normal.        Behavior: Behavior normal.     Lab Results  Component Value Date   WBC 8.3 12/17/2023   HGB 13.5 12/17/2023   HCT 39.7 12/17/2023   PLT 251.0 12/17/2023   GLUCOSE 98 12/17/2023   CHOL 194 04/29/2023   TRIG 223.0 (H) 04/29/2023   HDL 58.60 04/29/2023   LDLDIRECT 94.0 04/29/2023   LDLCALC 94 02/15/2022   ALT 24 04/29/2023   AST 22 04/29/2023   NA 140 12/17/2023   K 4.0 12/17/2023   CL 101 12/17/2023   CREATININE 0.75 12/17/2023   BUN 20 12/17/2023   CO2 31 12/17/2023   TSH 1.99 04/29/2023   HGBA1C 5.7 04/29/2023    MM 3D SCREENING MAMMOGRAM BILATERAL BREAST Result Date: 01/30/2023 CLINICAL DATA:  Screening. EXAM: DIGITAL SCREENING BILATERAL MAMMOGRAM WITH TOMOSYNTHESIS AND CAD TECHNIQUE: Bilateral screening digital craniocaudal and mediolateral  oblique mammograms were obtained. Bilateral screening digital breast tomosynthesis was performed. The images were evaluated with computer-aided detection. COMPARISON:  Previous exam(s). ACR Breast Density Category d: The breasts are extremely dense, which lowers the sensitivity of mammography. FINDINGS: There are no findings suspicious for malignancy. IMPRESSION: No mammographic evidence of malignancy. A result letter of this screening mammogram will be mailed directly to the patient. RECOMMENDATION: Screening mammogram in one year. (Code:SM-B-01Y) BI-RADS CATEGORY  1: Negative. Electronically Signed   By: Emmaline Kluver M.D.   On: 01/30/2023 15:38    Assessment & Plan:  Primary hypertension- EKG is negative for LVH. Her BP  is over-controlled. Will discontinue the CCB. -     Basic metabolic panel; Future -     CBC with Differential/Platelet; Future -     EKG 12-Lead  Stage 3b chronic kidney disease (HCC)- Will avoid nephrotoxic agents  -     Basic metabolic panel; Future  Primary osteoarthritis involving multiple joints -     traMADol HCl; Take 1 tablet (50 mg total) by mouth every 12 (twelve) hours as needed.  Dispense: 180 tablet; Refill: 1     Follow-up: Return in about 6 months (around 06/15/2024).  Sanda Linger, MD

## 2023-12-17 NOTE — Patient Instructions (Signed)
 Hypertension, Adult High blood pressure (hypertension) is when the force of blood pumping through the arteries is too strong. The arteries are the blood vessels that carry blood from the heart throughout the body. Hypertension forces the heart to work harder to pump blood and may cause arteries to become narrow or stiff. Untreated or uncontrolled hypertension can lead to a heart attack, heart failure, a stroke, kidney disease, and other problems. A blood pressure reading consists of a higher number over a lower number. Ideally, your blood pressure should be below 120/80. The first ("top") number is called the systolic pressure. It is a measure of the pressure in your arteries as your heart beats. The second ("bottom") number is called the diastolic pressure. It is a measure of the pressure in your arteries as the heart relaxes. What are the causes? The exact cause of this condition is not known. There are some conditions that result in high blood pressure. What increases the risk? Certain factors may make you more likely to develop high blood pressure. Some of these risk factors are under your control, including: Smoking. Not getting enough exercise or physical activity. Being overweight. Having too much fat, sugar, calories, or salt (sodium) in your diet. Drinking too much alcohol. Other risk factors include: Having a personal history of heart disease, diabetes, high cholesterol, or kidney disease. Stress. Having a family history of high blood pressure and high cholesterol. Having obstructive sleep apnea. Age. The risk increases with age. What are the signs or symptoms? High blood pressure may not cause symptoms. Very high blood pressure (hypertensive crisis) may cause: Headache. Fast or irregular heartbeats (palpitations). Shortness of breath. Nosebleed. Nausea and vomiting. Vision changes. Severe chest pain, dizziness, and seizures. How is this diagnosed? This condition is diagnosed by  measuring your blood pressure while you are seated, with your arm resting on a flat surface, your legs uncrossed, and your feet flat on the floor. The cuff of the blood pressure monitor will be placed directly against the skin of your upper arm at the level of your heart. Blood pressure should be measured at least twice using the same arm. Certain conditions can cause a difference in blood pressure between your right and left arms. If you have a high blood pressure reading during one visit or you have normal blood pressure with other risk factors, you may be asked to: Return on a different day to have your blood pressure checked again. Monitor your blood pressure at home for 1 week or longer. If you are diagnosed with hypertension, you may have other blood or imaging tests to help your health care provider understand your overall risk for other conditions. How is this treated? This condition is treated by making healthy lifestyle changes, such as eating healthy foods, exercising more, and reducing your alcohol intake. You may be referred for counseling on a healthy diet and physical activity. Your health care provider may prescribe medicine if lifestyle changes are not enough to get your blood pressure under control and if: Your systolic blood pressure is above 130. Your diastolic blood pressure is above 80. Your personal target blood pressure may vary depending on your medical conditions, your age, and other factors. Follow these instructions at home: Eating and drinking  Eat a diet that is high in fiber and potassium, and low in sodium, added sugar, and fat. An example of this eating plan is called the DASH diet. DASH stands for Dietary Approaches to Stop Hypertension. To eat this way: Eat  plenty of fresh fruits and vegetables. Try to fill one half of your plate at each meal with fruits and vegetables. Eat whole grains, such as whole-wheat pasta, brown rice, or whole-grain bread. Fill about one  fourth of your plate with whole grains. Eat or drink low-fat dairy products, such as skim milk or low-fat yogurt. Avoid fatty cuts of meat, processed or cured meats, and poultry with skin. Fill about one fourth of your plate with lean proteins, such as fish, chicken without skin, beans, eggs, or tofu. Avoid pre-made and processed foods. These tend to be higher in sodium, added sugar, and fat. Reduce your daily sodium intake. Many people with hypertension should eat less than 1,500 mg of sodium a day. Do not drink alcohol if: Your health care provider tells you not to drink. You are pregnant, may be pregnant, or are planning to become pregnant. If you drink alcohol: Limit how much you have to: 0-1 drink a day for women. 0-2 drinks a day for men. Know how much alcohol is in your drink. In the [place] one drink equals one 12 oz bottle of beer (355 mL), one 5 oz glass of wine (148 mL), or one 1 oz glass of hard liquor (44 mL). Lifestyle  Work with your health care provider to maintain a healthy body weight or to lose weight. Ask what an ideal weight is for you. Get at least 30 minutes of exercise that causes your heart to beat faster (aerobic exercise) most days of the week. Activities may include walking, swimming, or biking. Include exercise to strengthen your muscles (resistance exercise), such as Pilates or lifting weights, as part of your weekly exercise routine. Try to do these types of exercises for 30 minutes at least 3 days a week. Do not use any products that contain nicotine or tobacco. These products include cigarettes, chewing tobacco, and vaping devices, such as e-cigarettes. If you need help quitting, ask your health care provider. Monitor your blood pressure at home as told by your health care provider. Keep all follow-up visits. This is important. Medicines Take over-the-counter and prescription medicines only as told by your health care provider. Follow directions carefully. Blood  pressure medicines must be taken as prescribed. Do not skip doses of blood pressure medicine. Doing this puts you at risk for problems and can make the medicine less effective. Ask your health care provider about side effects or reactions to medicines that you should watch for. Contact a health care provider if you: Think you are having a reaction to a medicine you are taking. Have headaches that keep coming back (recurring). Feel dizzy. Have swelling in your ankles. Have trouble with your vision. Get help right away if you: Develop a severe headache or confusion. Have unusual weakness or numbness. Feel faint. Have severe pain in your chest or abdomen. Vomit repeatedly. Have trouble breathing. These symptoms may be an emergency. Get help right away. Call 911. Do not wait to see if the symptoms will go away. Do not drive yourself to the hospital. Summary Hypertension is when the force of blood pumping through your arteries is too strong. If this condition is not controlled, it may put you at risk for serious complications. Your personal target blood pressure may vary depending on your medical conditions, your age, and other factors. For most people, a normal blood pressure is less than 120/80. Hypertension is treated with lifestyle changes, medicines, or a combination of both. Lifestyle changes include losing weight, eating a healthy,  low-sodium diet, exercising more, and limiting alcohol. This information is not intended to replace advice given to you by your health care provider. Make sure you discuss any questions you have with your health care provider. Document Revised: 08/22/2021 Document Reviewed: 08/22/2021 Elsevier Patient Education  2024 [employer].

## 2023-12-19 ENCOUNTER — Encounter: Payer: Self-pay | Admitting: Internal Medicine

## 2024-01-01 ENCOUNTER — Ambulatory Visit: Payer: Medicare Other | Admitting: Internal Medicine

## 2024-01-23 ENCOUNTER — Other Ambulatory Visit: Payer: Self-pay | Admitting: Internal Medicine

## 2024-01-23 DIAGNOSIS — R000 Tachycardia, unspecified: Secondary | ICD-10-CM

## 2024-01-23 DIAGNOSIS — I10 Essential (primary) hypertension: Secondary | ICD-10-CM

## 2024-01-24 ENCOUNTER — Other Ambulatory Visit: Payer: Self-pay | Admitting: Internal Medicine

## 2024-01-24 DIAGNOSIS — N952 Postmenopausal atrophic vaginitis: Secondary | ICD-10-CM

## 2024-03-06 ENCOUNTER — Other Ambulatory Visit: Payer: Self-pay | Admitting: Internal Medicine

## 2024-03-06 DIAGNOSIS — I10 Essential (primary) hypertension: Secondary | ICD-10-CM

## 2024-03-12 ENCOUNTER — Encounter: Payer: Self-pay | Admitting: Internal Medicine

## 2024-03-12 ENCOUNTER — Other Ambulatory Visit: Payer: Self-pay | Admitting: Internal Medicine

## 2024-03-12 DIAGNOSIS — J208 Acute bronchitis due to other specified organisms: Secondary | ICD-10-CM | POA: Insufficient documentation

## 2024-03-12 MED ORDER — NIRMATRELVIR/RITONAVIR (PAXLOVID)TABLET: 3.0000 | ORAL_TABLET | Freq: Two times a day (BID) | ORAL | 0 refills | Status: AC

## 2024-04-16 ENCOUNTER — Other Ambulatory Visit: Payer: Self-pay | Admitting: Internal Medicine

## 2024-04-16 DIAGNOSIS — I10 Essential (primary) hypertension: Secondary | ICD-10-CM

## 2024-04-16 DIAGNOSIS — E785 Hyperlipidemia, unspecified: Secondary | ICD-10-CM

## 2024-04-20 ENCOUNTER — Ambulatory Visit
Admission: RE | Admit: 2024-04-20 | Discharge: 2024-04-20 | Disposition: A | Discharge status: Home / Self Care | Payer: Medicare Other | Source: Ambulatory Visit | Attending: Internal Medicine | Admitting: Internal Medicine

## 2024-04-20 ENCOUNTER — Other Ambulatory Visit: Payer: Medicare Other

## 2024-04-20 DIAGNOSIS — Z1231 Encounter for screening mammogram for malignant neoplasm of breast: Secondary | ICD-10-CM

## 2024-04-24 ENCOUNTER — Other Ambulatory Visit: Payer: Self-pay | Admitting: Internal Medicine

## 2024-04-24 ENCOUNTER — Encounter: Payer: Self-pay | Admitting: Internal Medicine

## 2024-04-24 DIAGNOSIS — R000 Tachycardia, unspecified: Secondary | ICD-10-CM

## 2024-04-24 DIAGNOSIS — I10 Essential (primary) hypertension: Secondary | ICD-10-CM

## 2024-04-27 ENCOUNTER — Encounter: Payer: Self-pay | Admitting: Family Medicine

## 2024-04-27 ENCOUNTER — Ambulatory Visit (INDEPENDENT_AMBULATORY_CARE_PROVIDER_SITE_OTHER): Admitting: Family Medicine

## 2024-04-27 VITALS — BP 138/70 | HR 73 | Temp 98.6°F | Ht 65.0 in | Wt 137.2 lb

## 2024-04-27 DIAGNOSIS — R0789 Other chest pain: Secondary | ICD-10-CM | POA: Insufficient documentation

## 2024-04-27 DIAGNOSIS — M79622 Pain in left upper arm: Secondary | ICD-10-CM | POA: Diagnosis not present

## 2024-04-27 DIAGNOSIS — R9431 Abnormal electrocardiogram [ECG] [EKG]: Secondary | ICD-10-CM | POA: Diagnosis not present

## 2024-04-27 DIAGNOSIS — I10 Essential (primary) hypertension: Secondary | ICD-10-CM | POA: Diagnosis not present

## 2024-04-27 DIAGNOSIS — E785 Hyperlipidemia, unspecified: Secondary | ICD-10-CM

## 2024-04-27 DIAGNOSIS — R0981 Nasal congestion: Secondary | ICD-10-CM | POA: Insufficient documentation

## 2024-04-27 LAB — COMPREHENSIVE METABOLIC PANEL WITH GFR
ALT: 27 U/L (ref 0–35)
AST: 28 U/L (ref 0–37)
Albumin: 4.6 g/dL (ref 3.5–5.2)
Alkaline Phosphatase: 79 U/L (ref 39–117)
BUN: 26 mg/dL — ABNORMAL HIGH (ref 6–23)
CO2: 31 meq/L (ref 19–32)
Calcium: 11.1 mg/dL — ABNORMAL HIGH (ref 8.4–10.5)
Chloride: 100 meq/L (ref 96–112)
Creatinine, Ser: 0.94 mg/dL (ref 0.40–1.20)
GFR: 57.06 mL/min — ABNORMAL LOW (ref >60.00)
Glucose, Bld: 101 mg/dL — ABNORMAL HIGH (ref 70–99)
Potassium: 4.1 meq/L (ref 3.5–5.1)
Sodium: 139 meq/L (ref 135–145)
Total Bilirubin: 0.6 mg/dL (ref 0.2–1.2)
Total Protein: 8 g/dL (ref 6.0–8.3)

## 2024-04-27 LAB — CBC WITH DIFFERENTIAL/PLATELET
Basophils Absolute: 0.1 10*3/uL (ref 0.0–0.1)
Basophils Relative: 1 % (ref 0.0–3.0)
Eosinophils Absolute: 0.2 10*3/uL (ref 0.0–0.7)
Eosinophils Relative: 2.2 % (ref 0.0–5.0)
HCT: 43 % (ref 36.0–46.0)
Hemoglobin: 14.5 g/dL (ref 12.0–15.0)
Lymphocytes Relative: 27 % (ref 12.0–46.0)
Lymphs Abs: 2.3 10*3/uL (ref 0.7–4.0)
MCHC: 33.7 g/dL (ref 30.0–36.0)
MCV: 85.4 fl (ref 78.0–100.0)
Monocytes Absolute: 0.5 10*3/uL (ref 0.1–1.0)
Monocytes Relative: 5.9 % (ref 3.0–12.0)
Neutro Abs: 5.5 10*3/uL (ref 1.4–7.7)
Neutrophils Relative %: 63.9 % (ref 43.0–77.0)
Platelets: 258 10*3/uL (ref 150.0–400.0)
RBC: 5.03 Mil/uL (ref 3.87–5.11)
RDW: 14 % (ref 11.5–15.5)
WBC: 8.7 10*3/uL (ref 4.0–10.5)

## 2024-04-27 NOTE — Assessment & Plan Note (Signed)
 EKG today does show some ST abnormality with inverted T waves, this is changed from her last EKG D-dimer, troponin today

## 2024-04-27 NOTE — Assessment & Plan Note (Signed)
 CBC, CMP, troponin, D-dimer I do not think this is acute ischemia, possible ischemia when she had an episode of pain, will check labs and refer from there if needed

## 2024-04-27 NOTE — Progress Notes (Signed)
 Acute Office Visit  Subjective:     Patient ID: Angel Kim, female    DOB: 1941-07-12, 83 y.o.   MRN: [id number]  Chief Complaint  Patient presents with   Acute Visit    HPI Patient is in today for evaluation of episode of chest pain 4 days ago. States that she had an episode of left chest pain that radiated to her left axilla that lasted for a couple of hours. States that she did take some Tylenol , that did not really help her very much. Cardiac risk factors include hyperlipidemia, HTN, family history of cardiac disease. Has family history of breast cancer including sister and niece. Had recent mammogram 04/20/2024 that was negative. Denies any shortness of breath, diaphoresis, palpitations, numbness, tingling, jaw pain, arm pain, back pain associated with the episode. Reports that she is still sore in her left axilla.    ROS Per HPI      Objective:    BP 138/70 (BP Location: Left Arm, Patient Position: Sitting)   Pulse 73   Temp 98.6 F (37 C) (Temporal)   Ht 5' 5 (1.651 m)   Wt 137 lb 3.2 oz (62.2 kg)   SpO2 95%   BMI 22.83 kg/m    Physical Exam Vitals and nursing note reviewed. Exam conducted with a chaperone present Epifania Olden, CMA).  Constitutional:      General: She is not in acute distress.    Appearance: Normal appearance. She is normal weight.  HENT:     Head: Normocephalic and atraumatic.     Right Ear: External ear normal.     Left Ear: External ear normal.     Nose: Nose normal.     Mouth/Throat:     Mouth: Mucous membranes are moist.     Pharynx: Oropharynx is clear.   Eyes:     Extraocular Movements: Extraocular movements intact.     Pupils: Pupils are equal, round, and reactive to light.   Neck:     Vascular: No carotid bruit.   Cardiovascular:     Rate and Rhythm: Normal rate and regular rhythm.     Pulses: Normal pulses.     Heart sounds: Normal heart sounds. No murmur heard. Pulmonary:     Effort: Pulmonary effort is  normal. No respiratory distress.     Breath sounds: Normal breath sounds. No wheezing, rhonchi or rales.  Chest:      Comments: Area of tenderness with deep palpation, no appreciable lymph nodes or other masses.  No bruising, no erythema.  No skin changes in the area, no breast abnormalities, no nipple discharge  Musculoskeletal:        General: Normal range of motion.     Cervical back: Normal range of motion.     Right lower leg: No edema.     Left lower leg: No edema.  Lymphadenopathy:     Cervical: No cervical adenopathy.     Upper Body:     Left upper body: Axillary adenopathy present.   Skin:    General: Skin is warm and dry.   Neurological:     General: No focal deficit present.     Mental Status: She is alert and oriented to person, place, and time.   Psychiatric:        Mood and Affect: Mood normal.        Thought Content: Thought content normal.   EKG: Indication: Chest pain, hypertension, HLD Rate: 64 Interpretation: ST abnormality, inverted T waves Changes  from previous: Yes   No results found for any visits on 04/27/24.      Assessment & Plan:   Primary hypertension Assessment & Plan: Continue current medication regimen, usually controlled  Orders: -     CBC with Differential/Platelet -     Comprehensive metabolic panel with GFR -     Troponin T, High Sensitivity (hs-TnT) -     D-dimer, quantitative  Atypical chest pain Assessment & Plan: EKG today does show some ST abnormality with inverted T waves, this is changed from her last EKG D-dimer, troponin today  Orders: -     EKG 12-Lead -     CBC with Differential/Platelet -     Comprehensive metabolic panel with GFR -     Troponin T, High Sensitivity (hs-TnT) -     D-dimer, quantitative  Left axillary pain Assessment & Plan: Ultrasound ordered today  Orders: -     US  AXILLA LEFT; Future  Abnormal EKG Assessment & Plan: CBC, CMP, troponin, D-dimer I do not think this is acute ischemia,  possible ischemia when she had an episode of pain, will check labs and refer from there if needed   Hyperlipidemia LDL goal <100 Assessment & Plan: EKG, troponin, D-dimer      No orders of the defined types were placed in this encounter.   Return if symptoms worsen or fail to improve.  Corean LITTIE Ku, FNP

## 2024-04-27 NOTE — Assessment & Plan Note (Signed)
 EKG, troponin, D-dimer

## 2024-04-27 NOTE — Assessment & Plan Note (Signed)
Ultrasound ordered today

## 2024-04-27 NOTE — Assessment & Plan Note (Signed)
 Continue current medication regimen, usually controlled

## 2024-04-27 NOTE — Patient Instructions (Addendum)
 EKG shows some nonspecific T wave changes  We are checking labs today, will be in contact with any results that require further attention  I have ordered an ultrasound for you today. Someone will be reaching out to get you scheduled.   Follow-up with me for new or worsening symptoms.

## 2024-04-28 ENCOUNTER — Encounter: Payer: Self-pay | Admitting: Family Medicine

## 2024-04-30 ENCOUNTER — Ambulatory Visit: Payer: Self-pay

## 2024-04-30 ENCOUNTER — Encounter: Payer: Self-pay | Admitting: Family Medicine

## 2024-04-30 ENCOUNTER — Observation Stay (HOSPITAL_COMMUNITY)

## 2024-04-30 ENCOUNTER — Emergency Department (HOSPITAL_COMMUNITY)

## 2024-04-30 ENCOUNTER — Telehealth: Payer: Self-pay

## 2024-04-30 ENCOUNTER — Observation Stay (HOSPITAL_COMMUNITY)
Admission: EM | Admit: 2024-04-30 | Discharge: 2024-05-01 | Disposition: A | Discharge status: Home / Self Care | Source: Ambulatory Visit | Attending: Emergency Medicine | Admitting: Emergency Medicine

## 2024-04-30 ENCOUNTER — Encounter (HOSPITAL_COMMUNITY): Payer: Self-pay | Admitting: *Deleted

## 2024-04-30 ENCOUNTER — Encounter (HOSPITAL_COMMUNITY)
Admission: EM | Disposition: A | Discharge status: Home / Self Care | Payer: Self-pay | Source: Ambulatory Visit | Attending: Emergency Medicine

## 2024-04-30 ENCOUNTER — Other Ambulatory Visit: Payer: Self-pay

## 2024-04-30 DIAGNOSIS — Z87891 Personal history of nicotine dependence: Secondary | ICD-10-CM | POA: Insufficient documentation

## 2024-04-30 DIAGNOSIS — Z79899 Other long term (current) drug therapy: Secondary | ICD-10-CM | POA: Diagnosis not present

## 2024-04-30 DIAGNOSIS — E7800 Pure hypercholesterolemia, unspecified: Secondary | ICD-10-CM | POA: Diagnosis not present

## 2024-04-30 DIAGNOSIS — I10 Essential (primary) hypertension: Secondary | ICD-10-CM | POA: Diagnosis not present

## 2024-04-30 DIAGNOSIS — I214 Non-ST elevation (NSTEMI) myocardial infarction: Secondary | ICD-10-CM

## 2024-04-30 DIAGNOSIS — Z7982 Long term (current) use of aspirin: Secondary | ICD-10-CM | POA: Diagnosis not present

## 2024-04-30 DIAGNOSIS — I129 Hypertensive chronic kidney disease with stage 1 through stage 4 chronic kidney disease, or unspecified chronic kidney disease: Secondary | ICD-10-CM | POA: Diagnosis not present

## 2024-04-30 DIAGNOSIS — N183 Chronic kidney disease, stage 3 unspecified: Secondary | ICD-10-CM | POA: Insufficient documentation

## 2024-04-30 DIAGNOSIS — I409 Acute myocarditis, unspecified: Secondary | ICD-10-CM | POA: Insufficient documentation

## 2024-04-30 DIAGNOSIS — R911 Solitary pulmonary nodule: Secondary | ICD-10-CM | POA: Insufficient documentation

## 2024-04-30 DIAGNOSIS — E785 Hyperlipidemia, unspecified: Secondary | ICD-10-CM

## 2024-04-30 DIAGNOSIS — R079 Chest pain, unspecified: Secondary | ICD-10-CM | POA: Diagnosis present

## 2024-04-30 HISTORY — PX: LEFT HEART CATH AND CORONARY ANGIOGRAPHY: CATH118249

## 2024-04-30 LAB — CBC
HCT: 42.4 % (ref 36.0–46.0)
Hemoglobin: 14.1 g/dL (ref 12.0–15.0)
MCH: 29.3 pg (ref 26.0–34.0)
MCHC: 33.3 g/dL (ref 30.0–36.0)
MCV: 88 fL (ref 80.0–100.0)
Platelets: 245 10*3/uL (ref 150–400)
RBC: 4.82 MIL/uL (ref 3.87–5.11)
RDW: 13.8 % (ref 11.5–15.5)
WBC: 8.3 10*3/uL (ref 4.0–10.5)
nRBC: 0 % (ref 0.0–0.2)

## 2024-04-30 LAB — ECHOCARDIOGRAM COMPLETE
AR max vel: 2.13 cm2
AV Area VTI: 2.23 cm2
AV Area mean vel: 2.03 cm2
AV Mean grad: 3 mmHg
AV Peak grad: 4.2 mmHg
Ao pk vel: 1.02 m/s
Area-P 1/2: 3.16 cm2
Height: 65 in
S' Lateral: 3 cm
Weight: 2194.02 [oz_av]

## 2024-04-30 LAB — BASIC METABOLIC PANEL WITH GFR
Anion gap: 11 (ref 5–15)
BUN: 20 mg/dL (ref 8–23)
CO2: 23 mmol/L (ref 22–32)
Calcium: 9.8 mg/dL (ref 8.9–10.3)
Chloride: 101 mmol/L (ref 98–111)
Creatinine, Ser: 1.05 mg/dL — ABNORMAL HIGH (ref 0.44–1.00)
GFR, Estimated: 53 mL/min — ABNORMAL LOW (ref >60)
Glucose, Bld: 114 mg/dL — ABNORMAL HIGH (ref 70–99)
Potassium: 3.4 mmol/L — ABNORMAL LOW (ref 3.5–5.1)
Sodium: 135 mmol/L (ref 135–145)

## 2024-04-30 LAB — TROPONIN I (HIGH SENSITIVITY)
Troponin I (High Sensitivity): 119 ng/L (ref <18)
Troponin I (High Sensitivity): 148 ng/L (ref <18)
Troponin I (High Sensitivity): 151 ng/L (ref <18)
Troponin I (High Sensitivity): 144 ng/L (ref <18)

## 2024-04-30 LAB — D-DIMER, QUANTITATIVE: D-Dimer, Quant: 0.9 ug{FEU}/mL — ABNORMAL HIGH (ref <0.50)

## 2024-04-30 LAB — TROPONIN T, HIGH SENSITIVITY (HS-TNT): Troponin T (Highly Sensitive): 129 ng/L (ref <15)

## 2024-04-30 SURGERY — LEFT HEART CATH AND CORONARY ANGIOGRAPHY
Anesthesia: LOCAL

## 2024-04-30 MED ORDER — HEPARIN (PORCINE) IN NACL 1000-0.9 UT/500ML-% IV SOLN
INTRAVENOUS | Status: DC | PRN
  Administered 2024-04-30 (×2): 500 mL

## 2024-04-30 MED ORDER — SODIUM CHLORIDE 0.9 % WEIGHT BASED INFUSION: 3.0000 mL/kg/h | INTRAVENOUS | Status: DC

## 2024-04-30 MED ORDER — NITROGLYCERIN 0.4 MG SL SUBL: 0.4000 mg | SUBLINGUAL_TABLET | SUBLINGUAL | Status: DC | PRN

## 2024-04-30 MED ORDER — INDAPAMIDE 1.25 MG PO TABS
1.2500 mg | ORAL_TABLET | Freq: Every day | ORAL | Status: DC
  Administered 2024-05-01: 1.25 mg via ORAL
  Filled 2024-04-30: qty 1

## 2024-04-30 MED ORDER — IRBESARTAN 150 MG PO TABS
150.0000 mg | ORAL_TABLET | Freq: Every day | ORAL | Status: DC
  Administered 2024-05-01: 150 mg via ORAL
  Filled 2024-04-30: qty 1

## 2024-04-30 MED ORDER — IOHEXOL 350 MG/ML SOLN
INTRAVENOUS | Status: DC | PRN
  Administered 2024-04-30: 40 mL

## 2024-04-30 MED ORDER — MIDAZOLAM HCL 2 MG/2ML IJ SOLN
INTRAMUSCULAR | Status: AC
  Filled 2024-04-30: qty 2

## 2024-04-30 MED ORDER — ACETAMINOPHEN 325 MG PO TABS: 650.0000 mg | ORAL_TABLET | ORAL | Status: DC | PRN

## 2024-04-30 MED ORDER — ASPIRIN 81 MG PO TBEC
81.0000 mg | DELAYED_RELEASE_TABLET | Freq: Every day | ORAL | Status: DC
  Administered 2024-05-01: 81 mg via ORAL
  Filled 2024-04-30: qty 1

## 2024-04-30 MED ORDER — ONDANSETRON HCL 4 MG/2ML IJ SOLN: 4.0000 mg | Freq: Four times a day (QID) | INTRAMUSCULAR | Status: DC | PRN

## 2024-04-30 MED ORDER — HEPARIN SODIUM (PORCINE) 1000 UNIT/ML IJ SOLN
INTRAMUSCULAR | Status: DC | PRN
Start: 2024-04-30 — End: 2024-04-30
  Administered 2024-04-30: 3000 [IU] via INTRAVENOUS

## 2024-04-30 MED ORDER — ESTRADIOL 0.5 MG PO TABS
0.5000 mg | ORAL_TABLET | Freq: Every day | ORAL | Status: DC
  Administered 2024-05-01: 0.5 mg via ORAL
  Filled 2024-04-30: qty 1

## 2024-04-30 MED ORDER — METOPROLOL TARTRATE 50 MG PO TABS
50.0000 mg | ORAL_TABLET | Freq: Two times a day (BID) | ORAL | Status: DC
  Administered 2024-04-30 – 2024-05-01 (×2): 50 mg via ORAL
  Filled 2024-04-30 (×2): qty 1

## 2024-04-30 MED ORDER — POTASSIUM CHLORIDE CRYS ER 20 MEQ PO TBCR
40.0000 meq | EXTENDED_RELEASE_TABLET | Freq: Once | ORAL | Status: AC
  Administered 2024-04-30: 40 meq via ORAL
  Filled 2024-04-30: qty 2

## 2024-04-30 MED ORDER — HEPARIN SODIUM (PORCINE) 1000 UNIT/ML IJ SOLN
INTRAMUSCULAR | Status: AC
Start: 2024-04-30 — End: 2024-04-30
  Filled 2024-04-30: qty 10

## 2024-04-30 MED ORDER — MIDAZOLAM HCL 2 MG/2ML IJ SOLN
INTRAMUSCULAR | Status: DC | PRN
  Administered 2024-04-30: 2 mg via INTRAVENOUS

## 2024-04-30 MED ORDER — SODIUM CHLORIDE 0.9 % IV SOLN: 250.0000 mL | INTRAVENOUS | Status: DC | PRN

## 2024-04-30 MED ORDER — LIDOCAINE HCL (PF) 1 % IJ SOLN
INTRAMUSCULAR | Status: AC
  Filled 2024-04-30: qty 30

## 2024-04-30 MED ORDER — SODIUM CHLORIDE 0.9 % WEIGHT BASED INFUSION: 1.0000 mL/kg/h | INTRAVENOUS | Status: DC

## 2024-04-30 MED ORDER — ASPIRIN 81 MG PO CHEW
324.0000 mg | CHEWABLE_TABLET | Freq: Once | ORAL | Status: AC
  Administered 2024-04-30: 243 mg via ORAL
  Filled 2024-04-30: qty 4

## 2024-04-30 MED ORDER — HEPARIN BOLUS VIA INFUSION
3700.0000 [IU] | Freq: Once | INTRAVENOUS | Status: AC
  Administered 2024-04-30: 3700 [IU] via INTRAVENOUS
  Filled 2024-04-30: qty 3700

## 2024-04-30 MED ORDER — HEPARIN (PORCINE) IN NACL 2-0.9 UNITS/ML
INTRAMUSCULAR | Status: DC | PRN
  Administered 2024-04-30: 10 mL via INTRA_ARTERIAL

## 2024-04-30 MED ORDER — VERAPAMIL HCL 2.5 MG/ML IV SOLN
INTRAVENOUS | Status: AC
  Filled 2024-04-30: qty 2

## 2024-04-30 MED ORDER — LIDOCAINE HCL (PF) 1 % IJ SOLN
INTRAMUSCULAR | Status: DC | PRN
  Administered 2024-04-30: 2 mL via INTRADERMAL

## 2024-04-30 MED ORDER — ATORVASTATIN CALCIUM 40 MG PO TABS
40.0000 mg | ORAL_TABLET | Freq: Every day | ORAL | Status: DC
  Administered 2024-04-30 – 2024-05-01 (×2): 40 mg via ORAL
  Filled 2024-04-30 (×2): qty 1

## 2024-04-30 MED ORDER — FENTANYL CITRATE (PF) 100 MCG/2ML IJ SOLN
INTRAMUSCULAR | Status: DC | PRN
  Administered 2024-04-30: 25 ug via INTRAVENOUS

## 2024-04-30 MED ORDER — SODIUM CHLORIDE 0.9% FLUSH: 3.0000 mL | INTRAVENOUS | Status: DC | PRN

## 2024-04-30 MED ORDER — SODIUM CHLORIDE 0.9 % IV BOLUS
500.0000 mL | Freq: Once | INTRAVENOUS | Status: AC
  Administered 2024-04-30: 500 mL via INTRAVENOUS

## 2024-04-30 MED ORDER — MELATONIN 3 MG PO TABS
3.0000 mg | ORAL_TABLET | Freq: Every day | ORAL | Status: DC
  Administered 2024-04-30: 3 mg via ORAL
  Filled 2024-04-30: qty 1

## 2024-04-30 MED ORDER — FENTANYL CITRATE (PF) 100 MCG/2ML IJ SOLN
INTRAMUSCULAR | Status: AC
  Filled 2024-04-30: qty 2

## 2024-04-30 MED ORDER — HEPARIN (PORCINE) 25000 UT/250ML-% IV SOLN
750.0000 [IU]/h | INTRAVENOUS | Status: DC
  Administered 2024-04-30: 750 [IU]/h via INTRAVENOUS
  Filled 2024-04-30: qty 250

## 2024-04-30 MED ORDER — FEXOFENADINE HCL 60 MG PO TABS
60.0000 mg | ORAL_TABLET | Freq: Every day | ORAL | Status: DC
  Administered 2024-05-01: 60 mg via ORAL
  Filled 2024-04-30: qty 1

## 2024-04-30 MED ORDER — IOHEXOL 350 MG/ML SOLN
75.0000 mL | Freq: Once | INTRAVENOUS | Status: AC | PRN
  Administered 2024-04-30: 75 mL via INTRAVENOUS

## 2024-04-30 SURGICAL SUPPLY — 6 items
CATH 5FR JL3.5 JR4 ANG PIG MP (CATHETERS) IMPLANT
DEVICE RAD COMP TR BAND LRG (VASCULAR PRODUCTS) IMPLANT
GLIDESHEATH SLEND A-KIT 6F 22G (SHEATH) IMPLANT
GUIDEWIRE INQWIRE 1.5J.035X260 (WIRE) IMPLANT
PACK CARDIAC CATHETERIZATION (CUSTOM PROCEDURE TRAY) ×1 IMPLANT
SET ATX-X65L (MISCELLANEOUS) IMPLANT

## 2024-04-30 NOTE — Interval H&P Note (Signed)
 History and Physical Interval Note:  04/30/2024 5:56 PM  Angel Kim  has presented today for surgery, with the diagnosis of chest pain.  The various methods of treatment have been discussed with the patient and family. After consideration of risks, benefits and other options for treatment, the patient has consented to  Procedure(s): LEFT HEART CATH AND CORONARY ANGIOGRAPHY (N/A) and possible coronary angioplasty for unstable angina/NSTEMI as a surgical intervention.  The patient's history has been reviewed, patient examined, no change in status, stable for surgery.  I have reviewed the patient's chart and labs.  Questions were answered to the patient's satisfaction.     Gordy Bergamo

## 2024-04-30 NOTE — ED Triage Notes (Signed)
 Pt has been having CP and sob and saw her PCP 3 days ago and had blood work done.  They called her and told her to come in due to her troponin being elevated.

## 2024-04-30 NOTE — H&P (Addendum)
 Cardiology Admission History and Physical   Patient ID: Angel Kim MRN: [id number]; DOB: 1941-08-05   Admission date: 04/30/2024  PCP:  Angel Debby CROME, MD   [facility] Providers Cardiologist:  None    Chief Complaint: NSTEMI  Patient Profile: Angel Kim is a 83 y.o. female with hypertension, hyperlipidemia who is being seen 04/30/2024 for the evaluation of NSTEMI.  History of Present Illness: Angel Kim has no prior cardiac history.  She reports a remote heart catheterization in 2017 that she says was normal.  Otherwise has no other significant medical history besides hypertension and hyperlipidemia.  Not a diabetic.  Reports she had smoked over 60 years ago for 5 to 6 years.  She notes extensive family history for MIs/CABG in both her parents and sisters.  Denies any drug or alcohol use.  Currently patient being evaluated for NSTEMI.  Troponins 148-119.  EKG with new biphasic T wave in V2.  Patient reports that she had acute 8 out of 10 chest pain that occurred Thursday while sitting at a meeting.  It is radiating to her left armpit.  Had no accompanying shortness of breath, nausea, diaphoresis.  Pain persisted for several hours throughout the day but then suddenly subsided at the nighttime.  She called and scheduled appointment with her PCP on June 30 who had ordered EKG and outpatient troponins.  Troponins were elevated so she was told to come to the emergency room.    Currently patient without any chest pain.  She reports that she has had intermittent complaints for the past several days however not nearly as intense as her initial episode.  Also notes during the same timeframe that she has had significant fatigue.  She is very functional and lives by herself as she is a widow but has a son that lives 1 house down and takes good care of her.  She is self-sufficient, ambulating but does not walk a ton due to balance issues.  Otherwise does not have any complaints of  shortness of breath, peripheral edema, orthopnea, lightheadedness, passing out or falls.  Notes very minor palpitations which she is not concerned about.  CTA negative for PE.  She did have some scattered pulmonary micronodules.  Noncontrast CT scan recommended at 12 months if patient was high risk.  No follow-up needed if patient felt to be low risk. chest x-ray negative.  Potassium 3.4, creatinine 1.05.  Hemoglobin 14.1.    Past Medical History:  Diagnosis Date   Allergy 1998   Drug allergy   Arthritis    Cataract 2019   Bilateral surgery 2019   Chronic kidney disease    CKD Stg 3   GERD (gastroesophageal reflux disease)    History of hiatal hernia    History of kidney stones    Hyperlipidemia 1990   On medication   Hypertension    Irregular heart rhythm    pt states has rapid rate at times, controlled with metoprolol    PONV (postoperative nausea and vomiting)    Past Surgical History:  Procedure Laterality Date   ABDOMINAL HYSTERECTOMY     APPENDECTOMY  2015   BREAST BIOPSY Right 2020   BREAST EXCISIONAL BIOPSY Left 2000   CYSTOSCOPY/URETEROSCOPY/HOLMIUM LASER/STENT PLACEMENT Bilateral 03/30/2022   Procedure: CYSTOSCOPY BILATERAL /URETEROSCOPY BILATERAL RETROGRADES CORRIN LASER/STENT PLACEMENT;  Surgeon: Watt Rush, MD;  Location: [facility] [place];  Service: Urology;  Laterality: Bilateral;   REDUCTION MAMMAPLASTY  1970     Medications Prior to Admission:  Prior to Admission medications   Medication Sig Start Date End Date Taking? Authorizing Provider  acetaminophen  (TYLENOL ) 500 MG tablet Take 500-1,000 mg by mouth every 6 (six) hours as needed for moderate pain (pain score 4-6).   Yes [provider]  aspirin EC 81 MG tablet Take 81 mg by mouth daily. Swallow whole.   Yes [provider]  atorvastatin  (LIPITOR) 40 MG tablet TAKE 1 TABLET BY MOUTH EVERY DAY 04/16/24  Yes Angel Debby CROME, MD  estradiol  (ESTRACE ) 0.5 MG tablet TAKE 1 TABLET BY  MOUTH EVERY DAY 01/24/24  Yes Angel Debby CROME, MD  Fexofenadine HCl (ALLEGRA ALLERGY PO) Take 1 tablet by mouth daily.   Yes [provider]  indapamide  (LOZOL ) 1.25 MG tablet TAKE 1 TABLET BY MOUTH DAILY. 04/24/24  Yes Angel Debby CROME, MD  MELATONIN PO Take 1 tablet by mouth daily.   Yes [provider]  metoprolol  tartrate (LOPRESSOR ) 50 MG tablet TAKE 1 TABLET BY MOUTH TWICE A DAY 04/24/24  Yes Angel Debby CROME, MD  Multiple Vitamin (MULTIVITAMIN ADULT PO) Take 1 capsule by mouth daily.   Yes [provider]  olmesartan  (BENICAR ) 20 MG tablet TAKE 1 TABLET BY MOUTH EVERY DAY 04/16/24  Yes Angel Debby CROME, MD     Allergies:    Allergies  Allergen Reactions   Amlodipine  Other (See Comments)    Leg edema   Ciprofloxacin Hives, Itching and Rash   Ofloxacin Hives, Itching and Rash    Social History:   Social History   Socioeconomic History   Marital status: Widowed    Spouse name: Not on file   Number of children: Not on file   Years of education: Not on file   Highest education level: Associate degree: occupational, Scientist, product/process development, or vocational program  Occupational History   Not on file  Tobacco Use   Smoking status: Former    Types: Cigarettes   Smokeless tobacco: Never  Substance and Sexual Activity   Alcohol use: Yes    Comment: occasional   Drug use: Never   Sexual activity: Not Currently    Partners: Male  Other Topics Concern   Not on file  Social History Narrative   Not on file   Social Drivers of Health   Financial Resource Strain: Low Risk  (04/26/2024)   Overall Financial Resource Strain (CARDIA)    Difficulty of Paying Living Expenses: Not very hard  Food Insecurity: No Food Insecurity (04/26/2024)   Hunger Vital Sign    Worried About Running Out of Food in the Last Year: Never true    Ran Out of Food in the Last Year: Never true  Transportation Needs: No Transportation Needs (04/26/2024)   PRAPARE - Scientist, research (physical sciences) (Medical): No    Lack of Transportation (Non-Medical): No  Physical Activity: Insufficiently Active (04/26/2024)   Exercise Vital Sign    Days of Exercise per Week: 1 day    Minutes of Exercise per Session: 30 min  Stress: Stress Concern Present (04/26/2024)   [employer] of Occupational Health - Occupational Stress Questionnaire    Feeling of Stress: To some extent  Social Connections: Moderately Integrated (04/26/2024)   Social Connection and Isolation Panel    Frequency of Communication with Friends and Family: More than three times a week    Frequency of Social Gatherings with Friends and Family: Once a week    Attends Religious Services: More than 4 times per year  Active Member of Clubs or Organizations: Yes    Attends Banker Meetings: More than 4 times per year    Marital Status: Widowed  Intimate Partner Violence: Not At Risk (09/02/2023)   Humiliation, Afraid, Rape, and Kick questionnaire    Fear of Current or Ex-Partner: No    Emotionally Abused: No    Physically Abused: No    Sexually Abused: No     Family History:  The patient's family history includes Arthritis in her paternal grandmother; Breast cancer in her sister; Cancer in her sister; Coronary artery disease in her sister; Diabetes in her father, mother, paternal grandmother, sister, and sister; Early death in her brother, brother, and father; Healthy in her sister; Heart attack in her father, mother, sister, and sister; Heart disease in her father, mother, sister, sister, and sister; Hyperlipidemia in her sister, sister, and sister; Hypertension in her mother, sister, sister, and sister; Kidney disease in her sister and sister; Kidney failure in her sister; Obesity in her mother and sister; Stroke in her maternal grandfather; Vision loss in her paternal grandmother.    ROS:  Please see the history of present illness. All other ROS reviewed and negative.     Physical  Exam/Data: Vitals:   04/30/24 1000 04/30/24 1030 04/30/24 1100 04/30/24 1321  BP: (!) 179/66 (!) 160/74 (!) 153/59 (!) 153/84  Pulse: 63 (!) 55 (!) 57 64  Resp: 14 15 15 16   Temp: 97.8 F (36.6 C)     TempSrc: Oral     SpO2: 100% 98% 100% 99%   No intake or output data in the 24 hours ending 04/30/24 1341    04/27/2024   10:55 AM 12/17/2023    9:29 AM 09/03/2023    1:04 PM  Last 3 Weights  Weight (lbs) 137 lb 3.2 oz 140 lb 3.2 oz 139 lb 12.8 oz  Weight (kg) 62.234 kg 63.594 kg 63.413 kg     There is no height or weight on file to calculate BMI.  General:  Well nourished, well developed, in no acute distress HEENT: normal Neck: no JVD Vascular: No carotid bruits; Distal pulses 2+ bilaterally   Cardiac:  normal S1, S2; RRR; no murmur  Lungs:  clear to auscultation bilaterally, no wheezing, rhonchi or rales  Abd: soft, nontender, no hepatomegaly  Ext: no edema Musculoskeletal:  No deformities, BUE and BLE strength normal and equal Skin: warm and dry  Neuro:  CNs 2-12 intact, no focal abnormalities noted Psych:  Normal affect   EKG: Sinus rhythm, heart rate 69.  New biphasic T wave in V2.  T wave inversion V3.  Other nonspecific T wave changes.  No acute ST elevation/depression.  Relevant CV Studies:   Laboratory Data: High Sensitivity Troponin:   Recent Labs  Lab 04/30/24 0917 04/30/24 0926  TROPONINIHS 148* 119*      Chemistry Recent Labs  Lab 04/27/24 1235 04/30/24 0926  NA 139 135  K 4.1 3.4*  CL 100 101  CO2 31 23  GLUCOSE 101* 114*  BUN 26* 20  CREATININE 0.94 1.05*  CALCIUM  11.1* 9.8  GFRNONAA  --  53*  ANIONGAP  --  11    Recent Labs  Lab 04/27/24 1235  PROT 8.0  ALBUMIN 4.6  AST 28  ALT 27  ALKPHOS 79  BILITOT 0.6   Lipids No results for input(s): CHOL, TRIG, HDL, LABVLDL, LDLCALC, CHOLHDL in the last 168 hours. Hematology Recent Labs  Lab 04/27/24 1235 04/30/24 0926  WBC  8.7 8.3  RBC 5.03 4.82  HGB 14.5 14.1  HCT 43.0  42.4  MCV 85.4 88.0  MCH  --  29.3  MCHC 33.7 33.3  RDW 14.0 13.8  PLT 258.0 245   Thyroid  No results for input(s): TSH, FREET4 in the last 168 hours. BNPNo results for input(s): BNP, PROBNP in the last 168 hours.  DDimer  Recent Labs  Lab 04/27/24 1235  DDIMER 0.90*    Radiology/Studies:  CT Angio Chest PE W and/or Wo Contrast Result Date: 04/30/2024 CLINICAL DATA:  Pulmonary embolism (PE) suspected, low to intermediate prob, positive D-dimer EXAM: CT ANGIOGRAPHY CHEST WITH CONTRAST TECHNIQUE: Multidetector CT imaging of the chest was performed using the standard protocol during bolus administration of intravenous contrast. Multiplanar CT image reconstructions and MIPs were obtained to evaluate the vascular anatomy. RADIATION DOSE REDUCTION: This exam was performed according to the departmental dose-optimization program which includes automated exposure control, adjustment of the mA and/or kV according to patient size and/or use of iterative reconstruction technique. CONTRAST:  75mL OMNIPAQUE  IOHEXOL  350 MG/ML SOLN COMPARISON:  Chest x-ray with April 30, 2024 FINDINGS: Thyroid : Bilateral subcentimeter thyroid  nodules. Cardiovascular: Satisfactory opacification of the pulmonary arteries to the segmental level. No evidence of pulmonary embolism. Normal heart size. Atherosclerotic calcifications of the aorta and coronary arteries. No pericardial effusion. Mediastinum/Nodes: No enlarged mediastinal, hilar, or axillary lymph nodes. Trachea, and esophagus demonstrate no significant findings. Lungs/Pleura: Multiple scattered pulmonary micro nodules throughout both lungs for example in left upper lobe 6/79, right lower lobe 6/102, right middle lobe 6/99, right upper lobe 6/56). No consolidation, architectural distortion or honeycombing. No pleural effusion or pneumothorax. Upper Abdomen: Small hiatal hernia. Multiple hypodense liver lesions incompletely assessed on current nondedicated exam likely  representing cysts. Nonobstructing nephrolithiasis of the right kidney. 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 Review of the MIP images confirms the above findings. IMPRESSION: No suspicious finding to suggest pulmonary embolism. Scattered pulmonary micronodules. Follow-up according to Fleischner guidelines below. Subcentimeter thyroid  nodules. No follow-up needed if patient is low-risk (and has no known or suspected primary neoplasm). Non-contrast chest CT can be considered in 12 months if patient is high-risk. This recommendation follows the consensus statement: Guidelines for Management of Incidental Pulmonary Nodules Detected on CT Images: From the Fleischner Society 2017; Radiology 2017; 284:228-243. Electronically Signed   By: Megan  Zare M.D.   On: 04/30/2024 12:35   DG Chest 2 View Result Date: 04/30/2024 CLINICAL DATA:  Chest pain and shortness of breath, elevated troponin EXAM: CHEST - 2 VIEW COMPARISON:  None Available. FINDINGS: Frontal and lateral views of the chest demonstrate an unremarkable cardiac silhouette. Atherosclerosis of the aortic arch. No acute airspace disease, effusion, or pneumothorax. No acute bony abnormalities. IMPRESSION: 1. No acute intrathoracic process. Electronically Signed   By: Ozell Daring M.D.   On: 04/30/2024 09:56     Assessment and Plan:  NSTEMI Hyperlipidemia Patient here today due to outpatient troponins being elevated.  Patient's current presentation is concerning for Wellens syndrome.  She notes severe chest pain with radiation that started Thursday and slightly subsided.  She has extensive family history of Mis. EKG with new acute biphasic T wave in V2.  Troponins 148-119.  Plan for cardiac catheterization today.  Not having any chest pain currently. Start IV heparin, already loaded on aspirin, already on Lopressor  50 mg twice daily, continue atorvastatin  40 mg. Check lipid panel, LP(a),  A1c.  Titrate statin accordingly. LDL 94 1  year ago. Get echocardiogram We will give her 1 dose of potassium 40 mEq prior to procedure.  Hypertension Normally well-controlled, postcardiac catheterization will titrate regimen accordingly. Will continue her home regimen continue indapamide  1.25 mg, switching olmesartan  20 mg to irbesartan 150 mg, continue Lopressor  50 mg twice daily.  Micro pulmonary nodules She did have scattered micro pulmonary nodules noted on CTA incidentally.  Does have remote smoking history of only 5 to 6 years.  Per radiology noncontrast CT recommended at 12 months if patient is high risk.  No further follow-up if patient felt to be low risk.  Informed Consent   Shared Decision Making/Informed Consent{  The risks [stroke (1 in 1000), death (1 in 1000), kidney failure [usually temporary] (1 in 500), bleeding (1 in 200), allergic reaction [possibly serious] (1 in 200)], benefits (diagnostic support and management of coronary artery disease) and alternatives of a cardiac catheterization were discussed in detail with Angel Kim and she is willing to proceed.      Of note she is partial code.  Amenable to everything except intubation.  Risk Assessment/Risk Scores:   TIMI Risk Score for Unstable Angina or Non-ST Elevation MI:   The patient's TIMI risk score is 5, which indicates a 26% risk of all cause mortality, new or recurrent myocardial infarction or need for urgent revascularization in the next 14 days.{    Code Status: Partial Code: NO Mechanical Ventilation NO Intubation Okay with CPR.  Severity of Illness: The appropriate patient status for this patient is OBSERVATION. Observation status is judged to be reasonable and necessary in order to provide the required intensity of service to ensure the patient's safety. The patient's presenting symptoms, physical exam findings, and initial radiographic and laboratory data in the context of their medical condition is  felt to place them at decreased risk for further clinical deterioration. Furthermore, it is anticipated that the patient will be medically stable for discharge from the hospital within 2 midnights of admission.   For questions or updates, please contact [facility] Please consult [email or web address] for contact info under     Signed, Thom LITTIE Sluder, PA-C  04/30/2024 1:41 PM    Patient seen and examined.  Agree with above documentation.  Angel Kim is an 83 year old female with history of hypertension, hyperlipidemia who presents with NSTEMI.  She reports no prior cardiac history.  She underwent a heart catheterization in 2017 that she reports was unremarkable.  She does have CAD risk factors with history of hypertension hyperlipidemia, remote smoking history, and extensive family history with both her parents and sisters having obstructive CAD.  She reports that last week she was in a meeting and began having left-sided chest pain that persisted for several hours.  She did not seek medical attention at that time.  She saw her PCP on 6/30 and outpatient troponin was ordered, which was elevated, prompting her to present to the ED.  In the ED, initial vital signs notable for BP 191/86, pulse 82, SpO2 95% on room air.  Labs notable for creatinine 1.05, potassium 3.4, troponin 148 > 119, hemoglobin 14.1, platelets 245.  EKG shows normal sinus rhythm, rate 69, biphasic T waves in V2, less than 1 mm ST depressions in inferior leads.  On exam, patient is alert and oriented, regular rate and rhythm, no murmurs, lungs CTAB, no LE edema or JVD.  For her chest pain, given mild troponin elevation and EKG changes, concerning for NSTEMI.  Continue heparin drip, aspirin.  Continue atorvastatin .  Will plan for LHC today. Risks and benefits of cardiac catheterization have been discussed with the patient.  These include bleeding, infection, kidney damage, stroke, heart attack, death.  The patient understands these risks and  is willing to proceed.  Lonni LITTIE Nanas, MD

## 2024-04-30 NOTE — Telephone Encounter (Signed)
 Called and spoke with patient. Informed patient of PCP advise/lab results and they expressed understanding. Patient will be going to [facility] ED

## 2024-04-30 NOTE — ED Notes (Signed)
 Patient transported to CT

## 2024-04-30 NOTE — Progress Notes (Signed)
 PHARMACY - ANTICOAGULATION CONSULT NOTE  Pharmacy Consult for IV heparin Indication: chest pain/ACS  Allergies  Allergen Reactions   Amlodipine  Other (See Comments)    Leg edema   Ciprofloxacin Hives, Itching and Rash   Ofloxacin Hives, Itching and Rash    Patient Measurements: Height: 5' 5 (165.1 cm) Weight: 62.2 kg (137 lb 2 oz) IBW/kg (Calculated) : 57 HEPARIN DW (KG): 62.2  Vital Signs: Temp: 97.8 F (36.6 C) (07/03 1000) Temp Source: Oral (07/03 1000) BP: 160/63 (07/03 1330) Pulse Rate: 64 (07/03 1330)  Labs: Recent Labs    04/30/24 0917 04/30/24 0926  HGB  --  14.1  HCT  --  42.4  PLT  --  245  CREATININE  --  1.05*  TROPONINIHS 148* 119*    Estimated Creatinine Clearance: 37.8 mL/min (A) (by C-G formula based on SCr of 1.05 mg/dL (H)).   Medical History: Past Medical History:  Diagnosis Date   Allergy 1998   Drug allergy   Arthritis    Cataract 2019   Bilateral surgery 2019   Chronic kidney disease    CKD Stg 3   GERD (gastroesophageal reflux disease)    History of hiatal hernia    History of kidney stones    Hyperlipidemia 1990   On medication   Hypertension    Irregular heart rhythm    pt states has rapid rate at times, controlled with metoprolol    PONV (postoperative nausea and vomiting)     Assessment: Angel Kim is a 83 y.o. year old female admitted on 04/30/2024 with concern for ACS. No anticoagulation prior to admission. Pharmacy consulted to dose heparin.  Goal of Therapy:  Heparin level 0.3-0.7 units/ml Monitor platelets by anticoagulation protocol: Yes   Plan:  Heparin 3700 units x 1 as bolus followed by heparin infusion at 750 units/hr 8h heparin level  Daily heparin level, CBC, and monitoring for bleeding F/u plans for anticoagulation and cards recs  Thank you for allowing pharmacy to participate in this patient's care.  Leonor GORMAN Bash, PharmD Emergency Medicine Clinical Pharmacist 04/30/2024,2:00 PM

## 2024-04-30 NOTE — ED Notes (Signed)
 Pt ambulated independently to bathroom.

## 2024-04-30 NOTE — ED Provider Notes (Signed)
 [address] EMERGENCY DEPARTMENT AT [facility] Provider Note   CSN: [id number] Arrival date & time: 04/30/24  9082     Patient presents with: Chest Pain   Angel Kim is a 83 y.o. female.  With a history of hypertension and CKD who presents to the ED for chest pain.  Patient first experienced sudden onset chest pain 1 week ago.  The pain improved temporarily after taking some Tylenol  but has come on intermittently.  Localized over the substernal and left anterior chest without radiation to other regions.  No associated shortness of breath diaphoresis nausea vomiting.  Patient was seen for this reason 3 days ago by primary care doctor.  Her high-sensitivity troponin was significantly elevated with new EKG changes.  D-dimer was also elevated at that time.  She was called today and instructed to come to the ED further evaluation.  Chest pain or other complaints at this time.  She reports taking her daily medications including 81 mg aspirin    Chest Pain      Prior to Admission medications   Medication Sig Start Date End Date Taking? Authorizing Provider  acetaminophen  (TYLENOL ) 500 MG tablet Take 500-1,000 mg by mouth every 6 (six) hours as needed for moderate pain (pain score 4-6).   Yes [provider]  aspirin EC 81 MG tablet Take 81 mg by mouth daily. Swallow whole.   Yes [provider]  atorvastatin  (LIPITOR) 40 MG tablet TAKE 1 TABLET BY MOUTH EVERY DAY 04/16/24  Yes Joshua Debby CROME, MD  estradiol  (ESTRACE ) 0.5 MG tablet TAKE 1 TABLET BY MOUTH EVERY DAY 01/24/24  Yes Joshua Debby CROME, MD  Fexofenadine HCl (ALLEGRA ALLERGY PO) Take 1 tablet by mouth daily.   Yes [provider]  indapamide  (LOZOL ) 1.25 MG tablet TAKE 1 TABLET BY MOUTH DAILY. 04/24/24  Yes Joshua Debby CROME, MD  MELATONIN PO Take 1 tablet by mouth daily.   Yes [provider]  metoprolol  tartrate (LOPRESSOR ) 50 MG tablet TAKE 1 TABLET BY MOUTH TWICE A DAY 04/24/24  Yes Joshua Debby CROME, MD  Multiple Vitamin (MULTIVITAMIN ADULT PO) Take 1 capsule by mouth daily.   Yes [provider]  olmesartan  (BENICAR ) 20 MG tablet TAKE 1 TABLET BY MOUTH EVERY DAY 04/16/24  Yes Joshua Debby CROME, MD    Allergies: Amlodipine , Ciprofloxacin, and Ofloxacin    Review of Systems  Cardiovascular:  Positive for chest pain.    Updated Vital Signs BP (!) 160/63   Pulse 64   Temp 97.8 F (36.6 C) (Oral)   Resp 16   SpO2 99%   Physical Exam Vitals and nursing note reviewed.  HENT:     Head: Normocephalic and atraumatic.  Eyes:     Pupils: Pupils are equal, round, and reactive to light.  Cardiovascular:     Rate and Rhythm: Normal rate and regular rhythm.  Pulmonary:     Effort: Pulmonary effort is normal.     Breath sounds: Normal breath sounds.  Abdominal:     Palpations: Abdomen is soft.     Tenderness: There is no abdominal tenderness.  Skin:    General: Skin is warm and dry.  Neurological:     Mental Status: She is alert.  Psychiatric:        Mood and Affect: Mood normal.     (all labs ordered are listed, but only abnormal results are displayed) Labs Reviewed  BASIC METABOLIC PANEL WITH GFR - Abnormal; Notable for the  following components:      Result Value   Potassium 3.4 (*)    Glucose, Bld 114 (*)    Creatinine, Ser 1.05 (*)    GFR, Estimated 53 (*)    All other components within normal limits  TROPONIN I (HIGH SENSITIVITY) - Abnormal; Notable for the following components:   Troponin I (High Sensitivity) 119 (*)    All other components within normal limits  TROPONIN I (HIGH SENSITIVITY) - Abnormal; Notable for the following components:   Troponin I (High Sensitivity) 148 (*)    All other components within normal limits  CBC  TROPONIN I (HIGH SENSITIVITY)  TROPONIN I (HIGH SENSITIVITY)    EKG: EKG Interpretation Date/Time:  Thursday April 30 2024 09:23:34 EDT Ventricular Rate:  69 PR Interval:  190 QRS Duration:  96 QT  Interval:  414 QTC Calculation: 443 R Axis:   40  Text Interpretation: Normal sinus rhythm ST & T wave abnormality, consider anterior ischemia Abnormal ECG When compared with ECG of 30-Mar-2022 08:40, PREVIOUS ECG IS PRESENT Confirmed by Pamella Sharper [phone]) on 04/30/2024 1:54:29 PM  Radiology: CT Angio Chest PE W and/or Wo Contrast Result Date: 04/30/2024 CLINICAL DATA:  Pulmonary embolism (PE) suspected, low to intermediate prob, positive D-dimer EXAM: CT ANGIOGRAPHY CHEST WITH CONTRAST TECHNIQUE: Multidetector CT imaging of the chest was performed using the standard protocol during bolus administration of intravenous contrast. Multiplanar CT image reconstructions and MIPs were obtained to evaluate the vascular anatomy. RADIATION DOSE REDUCTION: This exam was performed according to the departmental dose-optimization program which includes automated exposure control, adjustment of the mA and/or kV according to patient size and/or use of iterative reconstruction technique. CONTRAST:  75mL OMNIPAQUE  IOHEXOL  350 MG/ML SOLN COMPARISON:  Chest x-ray with April 30, 2024 FINDINGS: Thyroid : Bilateral subcentimeter thyroid  nodules. Cardiovascular: Satisfactory opacification of the pulmonary arteries to the segmental level. No evidence of pulmonary embolism. Normal heart size. Atherosclerotic calcifications of the aorta and coronary arteries. No pericardial effusion. Mediastinum/Nodes: No enlarged mediastinal, hilar, or axillary lymph nodes. Trachea, and esophagus demonstrate no significant findings. Lungs/Pleura: Multiple scattered pulmonary micro nodules throughout both lungs for example in left upper lobe 6/79, right lower lobe 6/102, right middle lobe 6/99, right upper lobe 6/56). No consolidation, architectural distortion or honeycombing. No pleural effusion or pneumothorax. Upper Abdomen: Small hiatal hernia. Multiple hypodense liver lesions incompletely assessed on current nondedicated exam likely representing  cysts. Nonobstructing nephrolithiasis of the right kidney. 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 Review of the MIP images confirms the above findings. IMPRESSION: No suspicious finding to suggest pulmonary embolism. Scattered pulmonary micronodules. Follow-up according to Fleischner guidelines below. Subcentimeter thyroid  nodules. No follow-up needed if patient is low-risk (and has no known or suspected primary neoplasm). Non-contrast chest CT can be considered in 12 months if patient is high-risk. This recommendation follows the consensus statement: Guidelines for Management of Incidental Pulmonary Nodules Detected on CT Images: From the Fleischner Society 2017; Radiology 2017; 284:228-243. Electronically Signed   By: Megan  Zare M.D.   On: 04/30/2024 12:35   DG Chest 2 View Result Date: 04/30/2024 CLINICAL DATA:  Chest pain and shortness of breath, elevated troponin EXAM: CHEST - 2 VIEW COMPARISON:  None Available. FINDINGS: Frontal and lateral views of the chest demonstrate an unremarkable cardiac silhouette. Atherosclerosis of the aortic arch. No acute airspace disease, effusion, or pneumothorax. No acute bony abnormalities. IMPRESSION: 1. No acute intrathoracic process. Electronically Signed   By: Sharper Daring  M.D.   On: 04/30/2024 09:56     .Critical Care  Performed by: Pamella Ozell LABOR, DO Authorized by: Pamella Ozell LABOR, DO   Critical care provider statement:    Critical care time (minutes):  35   Critical care was necessary to treat or prevent imminent or life-threatening deterioration of the following conditions:  Cardiac failure   Critical care was time spent personally by me on the following activities:  Development of treatment plan with patient or surrogate, discussions with consultants, evaluation of patient's response to treatment, examination of patient, ordering and review of laboratory studies, ordering and review  of radiographic studies, ordering and performing treatments and interventions, pulse oximetry, re-evaluation of patient's condition, review of old charts and obtaining history from patient or surrogate   I assumed direction of critical care for this patient from another provider in my specialty: no     Care discussed with: admitting provider      Medications Ordered in the ED  aspirin EC tablet 81 mg (has no administration in time range)  atorvastatin  (LIPITOR) tablet 40 mg (40 mg Oral Given 04/30/24 1321)  indapamide  (LOZOL ) tablet 1.25 mg (has no administration in time range)  metoprolol  tartrate (LOPRESSOR ) tablet 50 mg (has no administration in time range)  irbesartan (AVAPRO) tablet 150 mg (has no administration in time range)  estradiol  (ESTRACE ) tablet 0.5 mg (has no administration in time range)  melatonin tablet 3 mg (has no administration in time range)  fexofenadine (ALLEGRA) tablet 60 mg (has no administration in time range)  nitroGLYCERIN (NITROSTAT) SL tablet 0.4 mg (has no administration in time range)  acetaminophen  (TYLENOL ) tablet 650 mg (has no administration in time range)  ondansetron  (ZOFRAN ) injection 4 mg (has no administration in time range)  potassium chloride SA (KLOR-CON M) CR tablet 40 mEq (has no administration in time range)  aspirin chewable tablet 324 mg (243 mg Oral Given 04/30/24 1004)  sodium chloride  0.9 % bolus 500 mL (0 mLs Intravenous Stopped 04/30/24 1234)  iohexol  (OMNIPAQUE ) 350 MG/ML injection 75 mL (75 mLs Intravenous Contrast Given 04/30/24 1210)    Clinical Course as of 04/30/24 1354  Thu Apr 30, 2024  1053 Troponin still elevated.  Today at 119.  Down from 129.  Remains hemodynamically stable hypertensive.  No active chest pain.  GFR 53.  Will obtain CTA chest to look for PE given elevated D-dimer from few days ago.  Will obtain delta troponin [MP]  1300 No evidence of PE on CTA chest.  Discussed with cardiology who will evaluate patient in ED [MP]   1353 Discussed with cardiology PA Thom who has evaluated patient in ED.  Presentation most consistent with NSTEMI.  Patient will be admitted to cardiology service [MP]    Clinical Course User Index [MP] Pamella Ozell LABOR, DO                                 Medical Decision Making 83 year old female with history as above presenting under direction of her primary care doctor given recent chest pain with significantly elevated troponin in the office 3 days ago.  High-sensitivity troponin of 129 with D-dimer of 0.91 as well.  No active chest pain at this time.  EKG changes of new T wave inversions also.  Presentation most concerning for ACS with NSTEMI being most likely.  We also need to evaluate for PE considering elevated D-dimer and sudden onset chest pain.  Takes daily aspirin no other anticoagulation.  Both elevated D-dimer and high-sensitivity troponin may be explained in part due to CKD but considering her symptoms of chest pain and risk factors will complete cardiac and PE workup here.  Will obtain workup including repeat troponin metabolic panel CBC chest x-ray and EKG.  Once we have the most recent GFR we will consider CTA chest to evaluate for PE here.  Will provide 3X 81 mg ASA and continue to monitor on telemetry  Amount and/or Complexity of Data Reviewed Labs: ordered. Radiology: ordered.  Risk OTC drugs. Prescription drug management. Decision regarding hospitalization.        Final diagnoses:  NSTEMI (non-ST elevated myocardial infarction) [facility])    ED Discharge Orders     None          Pamella Ozell LABOR, DO 04/30/24 1354

## 2024-04-30 NOTE — Telephone Encounter (Signed)
 CRITICAL VALUE STICKER  CRITICAL VALUE: Troponin T 129  RECEIVER (on-site recipient of call): Hodges Treiber  DATE & TIME NOTIFIED: 8:18 04/30/2024  MESSENGER (representative from lab): Elouise Hollow)  MD NOTIFIED: Debby Molt MD (PCP), Corean Ku FNP (Ordering provider  TIME OF NOTIFICATION: 8:19  RESPONSE:  Awaiting response from PCP and ordering provider

## 2024-05-01 DIAGNOSIS — E7800 Pure hypercholesterolemia, unspecified: Secondary | ICD-10-CM | POA: Diagnosis not present

## 2024-05-01 DIAGNOSIS — B3322 Viral myocarditis: Secondary | ICD-10-CM | POA: Diagnosis not present

## 2024-05-01 DIAGNOSIS — U071 COVID-19: Secondary | ICD-10-CM | POA: Diagnosis not present

## 2024-05-01 DIAGNOSIS — I214 Non-ST elevation (NSTEMI) myocardial infarction: Secondary | ICD-10-CM | POA: Diagnosis not present

## 2024-05-01 DIAGNOSIS — I10 Essential (primary) hypertension: Secondary | ICD-10-CM | POA: Diagnosis not present

## 2024-05-01 DIAGNOSIS — I409 Acute myocarditis, unspecified: Secondary | ICD-10-CM | POA: Diagnosis not present

## 2024-05-01 DIAGNOSIS — E785 Hyperlipidemia, unspecified: Secondary | ICD-10-CM | POA: Diagnosis not present

## 2024-05-01 LAB — CBC
HCT: 38 % (ref 36.0–46.0)
Hemoglobin: 12.8 g/dL (ref 12.0–15.0)
MCH: 29.5 pg (ref 26.0–34.0)
MCHC: 33.7 g/dL (ref 30.0–36.0)
MCV: 87.6 fL (ref 80.0–100.0)
Platelets: 228 K/uL (ref 150–400)
RBC: 4.34 MIL/uL (ref 3.87–5.11)
RDW: 14 % (ref 11.5–15.5)
WBC: 7.7 K/uL (ref 4.0–10.5)
nRBC: 0 % (ref 0.0–0.2)

## 2024-05-01 LAB — LIPID PANEL
Cholesterol: 163 mg/dL (ref 0–200)
HDL: 47 mg/dL (ref >40)
LDL Cholesterol: 89 mg/dL (ref 0–99)
Total CHOL/HDL Ratio: 3.5 ratio
Triglycerides: 133 mg/dL (ref <150)
VLDL: 27 mg/dL (ref 0–40)

## 2024-05-01 LAB — BASIC METABOLIC PANEL WITH GFR
Anion gap: 5 (ref 5–15)
BUN: 16 mg/dL (ref 8–23)
CO2: 25 mmol/L (ref 22–32)
Calcium: 9.1 mg/dL (ref 8.9–10.3)
Chloride: 107 mmol/L (ref 98–111)
Creatinine, Ser: 0.82 mg/dL (ref 0.44–1.00)
GFR, Estimated: >60 mL/min (ref >60)
Glucose, Bld: 93 mg/dL (ref 70–99)
Potassium: 3.7 mmol/L (ref 3.5–5.1)
Sodium: 137 mmol/L (ref 135–145)

## 2024-05-01 LAB — HEMOGLOBIN A1C
Hgb A1c MFr Bld: 5.3 % (ref 4.8–5.6)
Mean Plasma Glucose: 105.41 mg/dL

## 2024-05-01 MED ORDER — INDAPAMIDE 1.25 MG PO TABS: 0.6500 mg | ORAL_TABLET | Freq: Every day | ORAL | Status: DC

## 2024-05-01 NOTE — Discharge Summary (Signed)
 Physician Discharge Summary      Patient ID: Angel Kim MRN: [id number] DOB/AGE: 81-Nov-1942 81 y.o. 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  No pressure gradient across the aortic valve.   Angiographic data: RCA: Large-caliber vessel, dominant, smooth and normal.  Large PDA and large PL branch. LM: Not present, separate ostia to LAD and CX. LAD: Gives origin to a very large D1 and several small diagonals, mid to distal segment of the LAD is tortuous but otherwise normal. LCx: Very large caliber vessel giving origin to a large OM1, several small marginals and continues as a large OM 3.  AV groove circumflex is small.      Impression and recommendations: Normal coronary arteries, right dominant circulation, separate ostia to the LAD and CX.   1. Left ventricular ejection fraction, by estimation, is 55 to 60%. The left ventricle has normal function. The left ventricle has no regional wall motion abnormalities. Left ventricular diastolic parameters were normal.  2. Right ventricular systolic function is normal. The right ventricular size is normal.  3. The mitral valve is grossly normal. Trivial mitral valve regurgitation. No evidence of mitral stenosis.  4. The aortic valve is tricuspid. There is moderate calcification of the aortic valve. Aortic valve regurgitation is not visualized. Aortic valve sclerosis/calcification is present, without any evidence of aortic stenosis.  5. The inferior vena cava is normal in size with greater than 50% respiratory variability, suggesting right atrial pressure of 3 mmHg.  CT Angio Chest PE W and/or Wo Contrast Normal heart size. Atherosclerotic calcifications of the aorta and coronary arteries. No pericardial  effusion. No suspicious finding to suggest pulmonary embolism. Scattered pulmonary micronodules. Follow-up according to Fleischner guidelines below. Subcentimeter thyroid  nodules. No follow-up needed if patient is low-risk (and has no known or suspected primary neoplasm). Non-contrast chest CT can be considered in 12 months if patient is high-risk.  Upper Abdomen: Small hiatal hernia. Multiple hypodense liver lesions incompletely assessed on current nondedicated exam likely representing cysts. Nonobstructing nephrolithiasis of the right kidney.  Hospital Course: Angel Kim is a 83 y.o. female  patient with primary hypertension, mild hypercholesterolemia, presenting with precordial pain and elevated serum troponins.  She also had abnormal EKG with T wave inversion in the septal leads suggestive of Wellens sign hence underwent cardiac catheterization on the day of admission on 04/30/2024 revealing double-barreled LM, otherwise normal coronary arteries.  Patient was tested positive for COVID-19 just a few weeks ago.  She remained asymptomatic with no further chest pain, on the day of discharge was comfortable and was ready to go home when I walked into the room around 8 AM this morning.  Recommendations on discharge: I suspect that her abnormal serum troponins that are flat and remained >100 and <150 x 4 suggest a low-grade post COVID infection myocarditis with no compromise in LVEF, very low grade elevation in serum troponins.  I will arrange follow-up visit with Dr. Medford Nanas to see whether she needs any of the evaluation or workup.  She will also need a follow-up BMP in 2 weeks as her serum creatinine has slightly worsened postcardiac catheterization. I reduced the dose of indapamide  to 1.25 mg 1/2 tablet daily and held her ARB until renal function is further evaluated.  Discharge Exam:    05/01/2024  8:51 AM 05/01/2024    7:43 AM 05/01/2024    6:15 AM  Vitals with BMI  Weight   134 lbs 3 oz   BMI   22.33  Systolic 119 121 884  Diastolic 65 64 51  Pulse 77 71 62     Physical Exam Neck:     Vascular: No carotid bruit or JVD.  Cardiovascular:     Rate and Rhythm: Normal rate and regular rhythm.     Pulses: Intact distal pulses.     Heart sounds: Normal heart sounds. No murmur heard.    No gallop.  Pulmonary:     Effort: Pulmonary effort is normal.     Breath sounds: Normal breath sounds.  Abdominal:     General: Bowel sounds are normal.     Palpations: Abdomen is soft.  Musculoskeletal:     Right lower leg: No edema.     Left lower leg: No edema.   Right radial arterial access site has healed well. Labs:   Lab Results  Component Value Date   WBC 7.7 05/01/2024   HGB 12.8 05/01/2024   HCT 38.0 05/01/2024   MCV 87.6 05/01/2024   PLT 228 05/01/2024    Recent Labs  Lab 04/27/24 1235 04/30/24 0926 05/01/24 0403  NA 139   < > 137  K 4.1   < > 3.7  CL 100   < > 107  CO2 31   < > 25  BUN 26*   < > 16  CREATININE 0.94   < > 0.82  CALCIUM  11.1*   < > 9.1  PROT 8.0  --   --   BILITOT 0.6  --   --   ALKPHOS 79  --   --   ALT 27  --   --   AST 28  --   --   GLUCOSE 101*   < > 93   < > = values in this interval not displayed.    Lipid Panel     Component Value Date/Time   CHOL 163 05/01/2024 0403   TRIG 133 05/01/2024 0403   HDL 47 05/01/2024 0403   CHOLHDL 3.5 05/01/2024 0403   VLDL 27 05/01/2024 0403   LDLCALC 89 05/01/2024 0403    BNP (last 3 results) No results for input(s): BNP in the last 8760 hours.  HEMOGLOBIN A1C Lab Results  Component Value Date   HGBA1C 5.3 05/01/2024   MPG 105.41 05/01/2024    Cardiac Panel (last 3 results) Recent Labs    04/30/24 0926 04/30/24 1317 04/30/24 1417  TROPONINIHS 119* 151* 144*     FOLLOW UP PLANS AND APPOINTMENTS  Allergies as of 05/01/2024       Reactions   Amlodipine  Other (See Comments)   Leg edema   Ciprofloxacin Hives, Itching, Rash   Ofloxacin Hives, Itching, Rash         Medication List     STOP taking these medications    aspirin  EC 81 MG tablet   olmesartan  20 MG tablet Commonly known as: BENICAR        TAKE these medications    acetaminophen  500 MG tablet Commonly known as: TYLENOL  Take 500-1,000 mg by mouth every 6 (six) hours as needed for moderate pain (pain score 4-6).   ALLEGRA  ALLERGY PO Take 1 tablet by mouth daily.   atorvastatin  40 MG tablet Commonly known as: LIPITOR TAKE 1 TABLET BY MOUTH EVERY DAY   estradiol  0.5 MG tablet Commonly known as:  ESTRACE  TAKE 1 TABLET BY MOUTH EVERY DAY   indapamide  1.25 MG tablet Commonly known as: LOZOL  Take 0.5 tablets (0.625 mg total) by mouth daily. What changed: how much to take   MELATONIN PO Take 1 tablet by mouth daily.   metoprolol  tartrate 50 MG tablet Commonly known as: LOPRESSOR  TAKE 1 TABLET BY MOUTH TWICE A DAY   MULTIVITAMIN ADULT PO Take 1 capsule by mouth daily.        Follow-up Information     Angel Debby CROME, MD. Schedule an appointment as soon as possible for a visit.   Specialty: Internal Medicine Why: To be seen in 2 weeks with BMP (Blood work to follow up kidney function) Contact information: [address] [place] [address] [phone]         Angel Kim, Angel Kim Follow up on 05/11/2024.   Specialties: Cardiology, Radiology Why: Cardiology Follow-up on 05/11/2024 at 9:15 AM. Contact information: [address] [place] [address] [phone]                  Angel Bergamo, MD, [facility] 05/01/2024, 4:12 PM [facility] [address] [place], [place] [address] Phone: [phone]. Fax:  [phone]

## 2024-05-01 NOTE — Discharge Instructions (Signed)
Radial Site Care Refer to this sheet in the next few weeks. These instructions provide you with information on caring for yourself after your procedure. Your caregiver may also give you more specific instructions. Your treatment has been planned according to current medical practices, but problems sometimes occur. Call your caregiver if you have any problems or questions after your procedure. HOME CARE INSTRUCTIONS  You may shower the day after the procedure.Remove the bandage (dressing) and gently wash the site with plain soap and water.Gently pat the site dry.   Do not apply powder or lotion to the site.   Do not submerge the affected site in water for 3 to 5 days.   Inspect the site at least twice daily.   Do not flex or bend the affected arm for 24 hours.   No lifting over 5 pounds (2.3 kg) for 5 days after your procedure.   Do not drive home if you are discharged the same day of the procedure. Have someone else drive you.   You may drive 24 hours after the procedure unless otherwise instructed by your caregiver.  What to expect:  Any bruising will usually fade within 1 to 2 weeks.   Blood that collects in the tissue (hematoma) may be painful to the touch. It should usually decrease in size and tenderness within 1 to 2 weeks.  SEEK IMMEDIATE MEDICAL CARE IF:  You have unusual pain at the radial site.   You have redness, warmth, swelling, or pain at the radial site.   You have drainage (other than a small amount of blood on the dressing).   You have chills.   You have a fever or persistent symptoms for more than 72 hours.   You have a fever and your symptoms suddenly get worse.   Your arm becomes pale, cool, tingly, or numb.   You have heavy bleeding from the site. Hold pressure on the site.

## 2024-05-02 ENCOUNTER — Encounter (HOSPITAL_COMMUNITY): Payer: Self-pay | Admitting: Cardiology

## 2024-05-04 ENCOUNTER — Telehealth: Payer: Self-pay

## 2024-05-04 ENCOUNTER — Other Ambulatory Visit: Payer: Self-pay | Admitting: Family Medicine

## 2024-05-04 ENCOUNTER — Encounter: Payer: Self-pay | Admitting: Internal Medicine

## 2024-05-04 DIAGNOSIS — M79622 Pain in left upper arm: Secondary | ICD-10-CM

## 2024-05-04 LAB — LIPOPROTEIN A (LPA): Lipoprotein (a): 68.9 nmol/L — ABNORMAL HIGH (ref <75.0)

## 2024-05-04 NOTE — Transitions of Care (Post Inpatient/ED Visit) (Signed)
   05/04/2024  Name: Angel Kim MRN: [id number] DOB: [birth year]  Today's TOC FU Call Status: Today's TOC FU Call Status:: Successful TOC FU Call Completed TOC FU Call Complete Date: 05/04/24 Patient's Name and Date of Birth confirmed.  Transition Care Management Follow-up Telephone Call Date of Discharge: 05/01/24 Discharge Facility: [facility]) Type of Discharge: Inpatient Admission Primary Inpatient Discharge Diagnosis:: NSTEMI How have you been since you were released from the hospital?: (S) Better (some intermittent chest pain) Any questions or concerns?: No  Items Reviewed: Did you receive and understand the discharge instructions provided?: Yes Medications obtained,verified, and reconciled?: Yes (Medications Reviewed) Any new allergies since your discharge?: No Dietary orders reviewed?: NA Do you have support at home?: No  Medications Reviewed Today: Medications Reviewed Today   Medications were not reviewed in this encounter     Home Care and Equipment/Supplies: Were Home Health Services Ordered?: NA Any new equipment or medical supplies ordered?: NA  Functional Questionnaire: Do you need assistance with bathing/showering or dressing?: No Do you need assistance with meal preparation?: No Do you need assistance with eating?: No Do you have difficulty maintaining continence: No Do you need assistance with getting out of bed/getting out of a chair/moving?: No Do you have difficulty managing or taking your medications?: No  Follow up appointments reviewed: PCP Follow-up appointment confirmed?: Yes Date of PCP follow-up appointment?: 05/18/24 Follow-up Provider: Dr. Joshua Specialist [facility] Follow-up appointment confirmed?: Yes Date of Specialist follow-up appointment?: 05/11/24 Follow-Up Specialty Provider:: Cardiology Do you need transportation to your follow-up appointment?: No Do you understand care options if your condition(s) worsen?: Yes-patient  verbalized understanding    SIGNATURE Charmaine Bloodgood, LPN [facility] Advisor [place] [facility] You Are. We Are. One [facility] Direct Dial [phone]

## 2024-05-06 ENCOUNTER — Other Ambulatory Visit (HOSPITAL_BASED_OUTPATIENT_CLINIC_OR_DEPARTMENT_OTHER)

## 2024-05-07 NOTE — Progress Notes (Signed)
 Cardiology Office Note:  .   Date:  05/11/2024  ID:  Angel Kim, DOB 01/16/1941, MRN [id number] PCP: Joshua Debby CROME, MD  [facility] Providers Cardiologist:  Lonni CROME Nanas, MD {  History of Present Illness: .   Angel Kim is a 83 y.o. 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  LHC no evidence of CAD, double-barreled LM.  Echocardiogram with preserved biventricular function with no significant valvular disease.  Social history  Very self-sufficient, lives independently but has a son that lives close.  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     Patient with no prior cardiac history.  Recently admitted 04/2024 for NSTEMI but cardiac catheterization with no evidence of CAD.  Felt to be possibly related to myocarditis given recent COVID infection 3 weeks prior.  Indapamide  was decreased to 1.25 mg half a tablet and olmesartan  was held given slight elevation in creatinine postcardiac catheterization.  Today patient presents for follow-up.she has been doing very well postdischarge without any complaints.  However, she has continued to report some episodes of chest pain although nothing as severe as her initial episode.  This chest pain does not occur very frequently and not even daily.  Sometimes last for about an hour, she will take a Tylenol  and this will relieve the pain.  She offers no other complaints.  Interestingly she reports that over 40 years ago she had an abnormal stress test which prompted cardiac catheterization.  She said the diagnostic catheterization showed severe RCA disease but the following day that recath and there was no evidence of CAD.  At that time she was told it was a vasospasm.  ROS: Denies: shortness of breath, orthopnea, peripheral edema,  palpitations, decreased exercise intolerance, fatigue, lightheadedness.   Studies Reviewed: SABRA    EKG Interpretation Date/Time:  Monday May 11 2024 09:56:45 EDT Ventricular Rate:  60 PR Interval:  200 QRS Duration:  90 QT Interval:  424 QTC Calculation: 424 R Axis:   1  Text Interpretation: Normal sinus rhythm T wave abnormality, consider anterior ischemia When compared with ECG of 01-May-2024 07:50, QT has shortened No significant change since last tracing Confirmed by Darryle Currier [phone]) on 05/11/2024 10:00:19 AM    Risk Assessment/Calculations:          Physical Exam:   VS:  BP (!) 176/71 (BP Location: Left Arm, Patient Position: Sitting, Cuff Size: Normal)   Pulse 69   Ht 5' 5 (1.651 m)   Wt 137 lb 3.2 oz (62.2 kg)   SpO2 97%   BMI 22.83 kg/m    Wt Readings from Last 3 Encounters:  05/11/24 137 lb 3.2 oz (62.2 kg)  05/01/24 134 lb 3.2 oz (60.9 kg)  04/27/24 137 lb 3.2 oz (62.2 kg)    GEN: Well nourished, well developed in no acute distress NECK: No JVD; No carotid bruits CARDIAC: RRR, no murmurs, rubs, gallops RESPIRATORY:  Clear to auscultation without rales, wheezing or rhonchi  ABDOMEN: Soft, non-tender, non-distended EXTREMITIES:  No edema; No deformity   ASSESSMENT AND PLAN: .    MINOCA NSTEMI 04/2024 with classic presentation of chest pain with radiation, cardiac catheterization with no evidence of CAD.  Presentation felt to be related to myocarditis given recent COVID infection.  Symptoms also do not seem obviously related to vasospasm.  Repeat EKG still showing anterior T wave inversions but stable. Radial cath site free of any acute complications Will start amlodipine  2.5 mg given she has had continued chest pain although very mild.  Treating for possible microvascular disease.  Also continuing with Lopressor  50 mg twice daily. She reported very mild right ankle edema on this previously but this was no concern to her and she is okay with taking. At this time  I do not think cardiac MRI is indicated given mild symptoms and cost burden is not appropriate.  Hypertension She takes her blood pressure at home and frequently this is in the 120s to 130s.  Blood pressure here is in the 170s.  She will let us  know if this is still elevated when she takes this at home. She can continue indapamide  1.25 mg, and the above.  Olmesartan  was held as well postdischarge.  Again check blood pressure and add back her previous regimen if uncontrolled.  She will follow-up with her PCP next week as well.  Hyperlipidemia LDL is 89 July 2025.  Good control. Continue with atorvastatin  40 mg.   Dispo: 3 to 4 months with Dr. Kate to evaluate response of amlodipine .  Signed, Thom LITTIE Sluder, PA-C

## 2024-05-11 ENCOUNTER — Ambulatory Visit: Attending: Physician Assistant | Admitting: Cardiology

## 2024-05-11 ENCOUNTER — Encounter: Payer: Self-pay | Admitting: Physician Assistant

## 2024-05-11 VITALS — BP 176/71 | HR 69 | Ht 65.0 in | Wt 137.2 lb

## 2024-05-11 DIAGNOSIS — I10 Essential (primary) hypertension: Secondary | ICD-10-CM | POA: Diagnosis present

## 2024-05-11 DIAGNOSIS — I21A9 Other myocardial infarction type: Secondary | ICD-10-CM | POA: Insufficient documentation

## 2024-05-11 DIAGNOSIS — I252 Old myocardial infarction: Secondary | ICD-10-CM | POA: Diagnosis present

## 2024-05-11 MED ORDER — AMLODIPINE BESYLATE 2.5 MG PO TABS: 2.5000 mg | ORAL_TABLET | Freq: Every day | ORAL | 3 refills | Status: DC

## 2024-05-11 NOTE — Patient Instructions (Signed)
 Medication Instructions:  START AMLODIPINE  2.5 MG DAILY *If you need a refill on your cardiac medications before your next appointment, please call your pharmacy*  Lab Work: NO LABS If you have labs (blood work) drawn today and your tests are completely normal, you will receive your results only by: MyChart Message (if you have MyChart) OR A paper copy in the mail If you have any lab test that is abnormal or we need to change your treatment, we will call you to review the results.  Testing/Procedures: NO TESTING  Follow-Up: At [facility], you and your health needs are our priority.  As part of our continuing mission to provide you with exceptional heart care, our providers are all part of one team.  This team includes your primary Cardiologist (physician) and Advanced Practice Providers or APPs (Physician Assistants and Nurse Practitioners) who all work together to provide you with the care you need, when you need it.  Your next appointment:   3-4 month(s)  Provider:   Lonni LITTIE Nanas, MD

## 2024-05-12 ENCOUNTER — Other Ambulatory Visit: Payer: Self-pay | Admitting: Family Medicine

## 2024-05-12 ENCOUNTER — Ambulatory Visit
Admission: RE | Admit: 2024-05-12 | Discharge: 2024-05-12 | Disposition: A | Discharge status: Home / Self Care | Source: Ambulatory Visit | Attending: Family Medicine | Admitting: Family Medicine

## 2024-05-12 ENCOUNTER — Ambulatory Visit
Admission: RE | Admit: 2024-05-12 | Discharge: 2024-05-12 | Disposition: A | Discharge status: Home / Self Care | Source: Ambulatory Visit | Attending: Family Medicine

## 2024-05-12 DIAGNOSIS — I10 Essential (primary) hypertension: Secondary | ICD-10-CM

## 2024-05-12 DIAGNOSIS — M79622 Pain in left upper arm: Secondary | ICD-10-CM

## 2024-05-12 DIAGNOSIS — E785 Hyperlipidemia, unspecified: Secondary | ICD-10-CM

## 2024-05-12 DIAGNOSIS — N644 Mastodynia: Secondary | ICD-10-CM

## 2024-05-12 DIAGNOSIS — R9431 Abnormal electrocardiogram [ECG] [EKG]: Secondary | ICD-10-CM

## 2024-05-12 DIAGNOSIS — R0789 Other chest pain: Secondary | ICD-10-CM

## 2024-05-18 ENCOUNTER — Ambulatory Visit (INDEPENDENT_AMBULATORY_CARE_PROVIDER_SITE_OTHER): Payer: Medicare Other | Admitting: Internal Medicine

## 2024-05-18 ENCOUNTER — Encounter: Payer: Self-pay | Admitting: Internal Medicine

## 2024-05-18 ENCOUNTER — Ambulatory Visit: Payer: Self-pay | Admitting: Internal Medicine

## 2024-05-18 VITALS — BP 146/86 | HR 56 | Temp 97.8°F | Resp 16 | Ht 65.0 in | Wt 137.4 lb

## 2024-05-18 DIAGNOSIS — R001 Bradycardia, unspecified: Secondary | ICD-10-CM | POA: Diagnosis not present

## 2024-05-18 DIAGNOSIS — R9431 Abnormal electrocardiogram [ECG] [EKG]: Secondary | ICD-10-CM | POA: Diagnosis not present

## 2024-05-18 DIAGNOSIS — I10 Essential (primary) hypertension: Secondary | ICD-10-CM | POA: Diagnosis not present

## 2024-05-18 DIAGNOSIS — F5104 Psychophysiologic insomnia: Secondary | ICD-10-CM | POA: Insufficient documentation

## 2024-05-18 LAB — TROPONIN I (HIGH SENSITIVITY): High Sens Troponin I: 5 ng/L (ref 2–17)

## 2024-05-18 LAB — TSH: TSH: 2.86 u[IU]/mL (ref 0.35–5.50)

## 2024-05-18 MED ORDER — TRAZODONE HCL 50 MG PO TABS: 25.0000 mg | ORAL_TABLET | Freq: Every evening | ORAL | 1 refills | Status: DC | PRN

## 2024-05-18 NOTE — Progress Notes (Signed)
 Subjective:  Patient ID: Angel Kim, female    DOB: 12/05/1940  Age: 83 y.o. MRN: [id number]  CC: Medical Management of Chronic Issues (5 month follow up. Patient states she doesn't have any explanation as to what's going on and would like to discuss with Dr. Joshua. She states that they told her they think its spasms of her coronary artery. They can't seem to figure out why her troponin levels were so high. ), Hospitalization Follow-up (Overnight stay. Patient states that she feels better since her hospital stay. ), and Hypertension   HPI Angel Kim presents for f/up ----  Discussed the use of AI scribe software for clinical note transcription with the patient, who gave verbal consent to proceed.  History of Present Illness Angel Kim is an 83 year old female who presents with chest pain following a recent COVID-19 infection.  She experienced COVID-19 in late May, initially mild, but developed sudden onset chest pain localized to the left chest and radiating into the armpit [place] two [place] She took Tylenol , which slightly alleviated the pain, and returned home.  The following day, she contacted the office and was seen by a PA on Monday. An EKG showed some changes, and blood work was performed. She did not receive results until she called the office on Thursday, at which point she was advised to go to the ER. Since then, she continues to experience episodes of chest pain, though they are not severe enough to require medication.  No shortness of breath, cold sweats, or clammy skin. She reports occasional dizziness and headaches. She reports that her heart rate has been low and questions why her troponin levels were elevated during her hospital stay.  Her current medications include amlodipine  and indapamide  for high blood pressure, which was adjusted during her hospital stay. She also takes melatonin for insomnia. She recalls a previous  medication causing ankle swelling, which resolved after discontinuation, but she is now on a low dose of that medication without issues.      Outpatient Medications Prior to Visit  Medication Sig Dispense Refill   acetaminophen  (TYLENOL ) 500 MG tablet Take 500-1,000 mg by mouth every 6 (six) hours as needed for moderate pain (pain score 4-6).     amLODipine  (NORVASC ) 2.5 MG tablet Take 1 tablet (2.5 mg total) by mouth daily. 90 tablet 3   atorvastatin  (LIPITOR) 40 MG tablet TAKE 1 TABLET BY MOUTH EVERY DAY 90 tablet 1   estradiol  (ESTRACE ) 0.5 MG tablet TAKE 1 TABLET BY MOUTH EVERY DAY 90 tablet 1   Fexofenadine  HCl (ALLEGRA  ALLERGY PO) Take 1 tablet by mouth daily.     indapamide  (LOZOL ) 1.25 MG tablet Take 0.5 tablets (0.625 mg total) by mouth daily.     metoprolol  tartrate (LOPRESSOR ) 50 MG tablet TAKE 1 TABLET BY MOUTH TWICE A DAY 180 tablet 0   Multiple Vitamin (MULTIVITAMIN ADULT PO) Take 1 capsule by mouth daily.     MELATONIN PO Take 1 tablet by mouth daily.     No facility-administered medications prior to visit.    ROS Review of Systems  Constitutional:  Negative for appetite change, chills, diaphoresis, fatigue and fever.  HENT: Negative.    Respiratory:  Negative for cough, choking, chest tightness, shortness of breath and stridor.   Cardiovascular:  Positive for chest pain. Negative for palpitations and leg swelling.  Gastrointestinal: Negative.  Negative for abdominal pain, constipation, diarrhea, nausea and vomiting.  Genitourinary: Negative.  Negative for difficulty urinating.  Musculoskeletal: Negative.  Negative for arthralgias, joint swelling and myalgias.  Skin: Negative.   Neurological: Negative.  Negative for dizziness and weakness.  Hematological:  Negative for adenopathy. Does not bruise/bleed easily.  Psychiatric/Behavioral:  Positive for sleep disturbance. Negative for decreased concentration and dysphoric mood. The patient is not nervous/anxious.      Objective:  BP (!) 146/86 (BP Location: Left Arm, Patient Position: Sitting, Cuff Size: Normal)   Pulse (!) 56   Temp 97.8 F (36.6 C) (Oral)   Resp 16   Ht 5' 5 (1.651 m)   Wt 137 lb 6.4 oz (62.3 kg)   SpO2 98%   BMI 22.86 kg/m   BP Readings from Last 3 Encounters:  05/18/24 (!) 146/86  05/11/24 (!) 176/71  05/01/24 119/65    Wt Readings from Last 3 Encounters:  05/18/24 137 lb 6.4 oz (62.3 kg)  05/11/24 137 lb 3.2 oz (62.2 kg)  05/01/24 134 lb 3.2 oz (60.9 kg)    Physical Exam Vitals reviewed.  Constitutional:      Appearance: Normal appearance.  HENT:     Mouth/Throat:     Mouth: Mucous membranes are moist.  Eyes:     General: No scleral icterus.    Conjunctiva/sclera: Conjunctivae normal.  Cardiovascular:     Rate and Rhythm: Regular rhythm. Bradycardia present.     Heart sounds: Normal heart sounds, S1 normal and S2 normal. No murmur heard.    No friction rub. No gallop.     Comments: EKG- SB, 55 bpm (new) ST/T wave changes are not new No Q waves Pulmonary:     Effort: Pulmonary effort is normal.     Breath sounds: No stridor. No wheezing, rhonchi or rales.  Abdominal:     General: Abdomen is flat.     Palpations: There is no mass.     Tenderness: There is no abdominal tenderness. There is no guarding.     Hernia: No hernia is present.  Musculoskeletal:     Cervical back: Neck supple.     Right lower leg: No edema.     Left lower leg: No edema.  Lymphadenopathy:     Cervical: No cervical adenopathy.  Skin:    General: Skin is warm and dry.  Neurological:     General: No focal deficit present.     Mental Status: She is alert. Mental status is at baseline.  Psychiatric:        Mood and Affect: Mood normal.        Behavior: Behavior normal.     Lab Results  Component Value Date   WBC 7.7 05/01/2024   HGB 12.8 05/01/2024   HCT 38.0 05/01/2024   PLT 228 05/01/2024   GLUCOSE 93 05/01/2024   CHOL 163 05/01/2024   TRIG 133 05/01/2024    HDL 47 05/01/2024   LDLDIRECT 94.0 04/29/2023   LDLCALC 89 05/01/2024   ALT 27 04/27/2024   AST 28 04/27/2024   NA 137 05/01/2024   K 3.7 05/01/2024   CL 107 05/01/2024   CREATININE 0.82 05/01/2024   BUN 16 05/01/2024   CO2 25 05/01/2024   TSH 2.86 05/18/2024   HGBA1C 5.3 05/01/2024    MM 3D DIAGNOSTIC MAMMOGRAM UNILATERAL LEFT BREAST Result Date: 05/12/2024 CLINICAL DATA:  LEFT breast axillary pain, recent screening mammogram in June 2025. Patient was admitted for chest pain with elevated troponins and otherwise negative cardiac workup EXAM: DIGITAL DIAGNOSTIC UNILATERAL LEFT MAMMOGRAM  WITH TOMOSYNTHESIS AND CAD; ULTRASOUND LEFT BREAST LIMITED TECHNIQUE: Left digital diagnostic mammography and breast tomosynthesis was performed. The images were evaluated with computer-aided detection. ; Targeted ultrasound examination of the left breast was performed. COMPARISON:  Previous exam(s). ACR Breast Density Category d: The breasts are extremely dense, which lowers the sensitivity of mammography. FINDINGS: Spot compression tomosynthesis views were obtained over the area of focal pain in the LEFT axillary breast. No suspicious mammographic finding is identified in this area. On physical exam, no suspicious mass is appreciated. Targeted LEFT breast ultrasound was performed in the area of pain at the axilla and upper breast. No suspicious solid or cystic mass is identified. Benign appearing LEFT axillary lymph nodes are noted with preserved echogenic hila and thin smooth cortices. IMPRESSION: No mammographic or sonographic evidence of malignancy at the site of pain in the LEFT breast. Any further workup of the patient's symptoms should be based on the clinical assessment. Recommend routine annual screening mammogram, due June 2026. Breast pain is a common condition, which will often resolve on its own without intervention. It can be affected by hormonal changes, medication side effect, weight changes and fit  of the bra. Pain may also be referred from other adjacent areas of the body. Breast pain may be improved by wearing adequate well-fitting support, over-the-counter topical and oral NSAID medication, low-fat diet, and ice/heat as needed. Studies have shown an improvement in cyclic pain with use of evening primrose oil, vitamin D and vitamin E. Clinical follow-up recommended to discuss any further work-up recommendations and appropriate treatment. RECOMMENDATION: Bilateral annual screening mammogram, due June 2026 I have discussed the findings and recommendations with the patient. If applicable, a reminder letter will be sent to the patient regarding the next appointment. BI-RADS CATEGORY  1: Negative. Electronically Signed   By: Corean Salter M.D.   On: 05/12/2024 10:22   US  LIMITED ULTRASOUND INCLUDING AXILLA LEFT BREAST  Result Date: 05/12/2024 CLINICAL DATA:  LEFT breast axillary pain, recent screening mammogram in June 2025. Patient was admitted for chest pain with elevated troponins and otherwise negative cardiac workup EXAM: DIGITAL DIAGNOSTIC UNILATERAL LEFT MAMMOGRAM WITH TOMOSYNTHESIS AND CAD; ULTRASOUND LEFT BREAST LIMITED TECHNIQUE: Left digital diagnostic mammography and breast tomosynthesis was performed. The images were evaluated with computer-aided detection. ; Targeted ultrasound examination of the left breast was performed. COMPARISON:  Previous exam(s). ACR Breast Density Category d: The breasts are extremely dense, which lowers the sensitivity of mammography. FINDINGS: Spot compression tomosynthesis views were obtained over the area of focal pain in the LEFT axillary breast. No suspicious mammographic finding is identified in this area. On physical exam, no suspicious mass is appreciated. Targeted LEFT breast ultrasound was performed in the area of pain at the axilla and upper breast. No suspicious solid or cystic mass is identified. Benign appearing LEFT axillary lymph nodes are noted with  preserved echogenic hila and thin smooth cortices. IMPRESSION: No mammographic or sonographic evidence of malignancy at the site of pain in the LEFT breast. Any further workup of the patient's symptoms should be based on the clinical assessment. Recommend routine annual screening mammogram, due June 2026. Breast pain is a common condition, which will often resolve on its own without intervention. It can be affected by hormonal changes, medication side effect, weight changes and fit of the bra. Pain may also be referred from other adjacent areas of the body. Breast pain may be improved by wearing adequate well-fitting support, over-the-counter topical and oral NSAID medication, low-fat diet, and  ice/heat as needed. Studies have shown an improvement in cyclic pain with use of evening primrose oil, vitamin D and vitamin E. Clinical follow-up recommended to discuss any further work-up recommendations and appropriate treatment. RECOMMENDATION: Bilateral annual screening mammogram, due June 2026 I have discussed the findings and recommendations with the patient. If applicable, a reminder letter will be sent to the patient regarding the next appointment. BI-RADS CATEGORY  1: Negative. Electronically Signed   By: Corean Salter M.D.   On: 05/12/2024 10:22    Assessment & Plan:   Primary hypertension- Her BP is not at goal. She agrees to stop taking melatonin. -     TSH; Future -     AMB Referral [facility] Care Management  Psychophysiological insomnia -     traZODone  HCl; Take 0.5 tablets (25 mg total) by mouth at bedtime as needed for sleep.  Dispense: 45 tablet; Refill: 1 -     TSH; Future  Bradycardia- This is caused by the BB and CCB. -     EKG 12-Lead -     TSH; Future  Abnormal electrocardiogram (ECG) (EKG) -     Troponin I (High Sensitivity); Future     Follow-up: Return in about 3 months (around 08/18/2024).  Debby Molt, MD

## 2024-05-18 NOTE — Patient Instructions (Signed)
 Bradycardia, Adult Bradycardia is a slower-than-normal heartbeat. A normal resting heart rate for an adult ranges from 60 to 100 beats per minute. With bradycardia, the resting heart rate is less than 60 beats per minute. Bradycardia can prevent enough oxygen from reaching certain areas of your body when you are active. It can be serious if it keeps enough oxygen from reaching your brain and other parts of your body. Bradycardia is not a problem for everyone. For some healthy adults, a slow resting heart rate is normal. What are the causes? This condition may be caused by: A problem with the heart, including: A problem with the heart's electrical system, such as a heart block. With a heart block, electrical signals between the chambers of the heart are partially or completely blocked, so they are not able to work as they should. A problem with the heart's natural pacemaker (sinus node). Heart disease. A heart attack. Heart damage. Lyme disease. A heart infection. A heart condition that is present at birth (congenital heart defect). Certain medicines that treat heart conditions. Certain conditions, such as hypothyroidism and obstructive sleep apnea. Problems with the balance of chemicals and other substances, like potassium, in the blood. Trauma. Radiation therapy. What increases the risk? You are more likely to develop this condition if you: Are age 51 or older. Have high blood pressure (hypertension), high cholesterol (hyperlipidemia), or diabetes. Drink heavily, use tobacco or nicotine products, or use drugs. What are the signs or symptoms? Symptoms of this condition include: Light-headedness. Feeling faint or fainting. Fatigue and weakness. Trouble with activity or exercise. Shortness of breath. Chest pain (angina). Drowsiness. Confusion. Dizziness. How is this diagnosed? This condition may be diagnosed based on: Your symptoms. Your medical history. A physical exam. During  the exam, your health care provider will listen to your heartbeat and check your pulse. To confirm the diagnosis, your health care provider may order tests, such as: Blood tests. An electrocardiogram (ECG). This test records the heart's electrical activity. The test can show how fast your heart is beating and whether the heartbeat is steady. A test in which you wear a portable device (event recorder or Holter monitor) to record your heart's electrical activity while you go about your day. An exercise test. How is this treated? Treatment for this condition depends on the cause of the condition and how severe your symptoms are. Treatment may involve: Treatment of the underlying condition. Changing your medicines or how much medicine you take. Having a small, battery-operated device called a pacemaker implanted under the skin. When bradycardia occurs, this device can be used to increase your heart rate and help your heart beat in a regular rhythm. Follow these instructions at home: Lifestyle Manage any health conditions that contribute to bradycardia as told by your health care provider. Follow a heart-healthy diet. A nutrition specialist (dietitian) can help educate you about healthy food options and changes. Follow an exercise program that is approved by your health care provider. Maintain a healthy weight. Try to reduce or manage your stress, such as with yoga or meditation. If you need help reducing stress, ask your health care provider. Do not use any products that contain nicotine or tobacco. These products include cigarettes, chewing tobacco, and vaping devices, such as e-cigarettes. If you need help quitting, ask your health care provider. Do not use illegal drugs. Alcohol use If you drink alcohol: Limit how much you have to: 0-1 drink a day for women who are not pregnant. 0-2 drinks a day  for men. Know how much alcohol is in a drink. In the [place] one drink equals one 12 oz bottle of  beer (355 mL), one 5 oz glass of wine (148 mL), or one 1 oz glass of hard liquor (44 mL). General instructions Take over-the-counter and prescription medicines only as told by your health care provider. Keep all follow-up visits. This is important. How is this prevented? In some cases, bradycardia may be prevented by: Treating underlying medical problems. Stopping behaviors or medicines that can trigger the condition. Contact a health care provider if: You feel light-headed or dizzy. You almost faint. You feel weak or are easily fatigued during physical activity. You experience confusion or have memory problems. Get help right away if: You faint. You have chest pains or an irregular heartbeat (palpitations). You have trouble breathing. These symptoms may represent a serious problem that is an emergency. Do not wait to see if the symptoms will go away. Get medical help right away. Call your local emergency services (911 in the [place] Do not drive yourself to the hospital. Summary Bradycardia is a slower-than-normal heartbeat. With bradycardia, the resting heart rate is less than 60 beats per minute. Treatment for this condition depends on the cause. Manage any health conditions that contribute to bradycardia as told by your health care provider. Do not use any products that contain nicotine or tobacco. These products include cigarettes, chewing tobacco, and vaping devices, such as e-cigarettes. Keep all follow-up visits. This is important. This information is not intended to replace advice given to you by your health care provider. Make sure you discuss any questions you have with your health care provider. Document Revised: 02/05/2021 Document Reviewed: 02/05/2021 Elsevier Patient Education  2024 [employer].

## 2024-05-26 ENCOUNTER — Telehealth: Payer: Self-pay | Admitting: *Deleted

## 2024-05-26 NOTE — Progress Notes (Signed)
 Care Guide Pharmacy Note  05/26/2024 Name: DEMONI PARMAR MRN: [id number] DOB: 05-11-1941  Referred By: Joshua Debby CROME, MD Reason for referral: Call Attempt #1 and Complex Care Management (Outreach to schedule referral with pharmacist )   HILDUR BAYER is a 83 y.o. year old female who is a primary care patient of Joshua Debby CROME, MD.  [name] was referred to the pharmacist for assistance related to: HTN  An unsuccessful telephone outreach was attempted today to contact the patient who was referred to the pharmacy team for assistance with medication management. Additional attempts will be made to contact the patient.  Thedford Franks, CMA [place]  [facility], [facility] Guide Direct Dial: [phone]  Fax: [phone] Website: [email or web address]

## 2024-05-26 NOTE — Progress Notes (Signed)
 Care Guide Pharmacy Note  05/26/2024 Name: Angel Kim MRN: [id number] DOB: 30-Apr-1941  Referred By: Joshua Debby CROME, MD Reason for referral: Call Attempt #1 and Complex Care Management (Outreach to schedule referral with pharmacist )   Angel Kim is a 83 y.o. year old female who is a primary care patient of Joshua Debby CROME, MD.  Angel Kim was referred to the pharmacist for assistance related to: HTN  Successful contact was made with the patient to discuss pharmacy services including being ready for the pharmacist to call at least 5 minutes before the scheduled appointment time and to have medication bottles and any blood pressure readings ready for review. The patient agreed to meet with the pharmacist via telephone visit on (06/08/2024  Thedford Franks, CMA   [facility], [facility] Guide Direct Dial: [phone]  Fax: [phone] Website: [email or web address]

## 2024-05-29 ENCOUNTER — Telehealth: Admitting: Physician Assistant

## 2024-05-29 DIAGNOSIS — J069 Acute upper respiratory infection, unspecified: Secondary | ICD-10-CM | POA: Diagnosis not present

## 2024-05-29 DIAGNOSIS — B9689 Other specified bacterial agents as the cause of diseases classified elsewhere: Secondary | ICD-10-CM | POA: Diagnosis not present

## 2024-05-29 MED ORDER — ONDANSETRON 4 MG PO TBDP: 4.0000 mg | ORAL_TABLET | Freq: Three times a day (TID) | ORAL | 0 refills | Status: DC | PRN

## 2024-05-29 MED ORDER — BENZONATATE 100 MG PO CAPS: 100.0000 mg | ORAL_CAPSULE | Freq: Three times a day (TID) | ORAL | 0 refills | Status: DC | PRN

## 2024-05-29 MED ORDER — AMOXICILLIN-POT CLAVULANATE 875-125 MG PO TABS: 1.0000 | ORAL_TABLET | Freq: Two times a day (BID) | ORAL | 0 refills | Status: DC

## 2024-05-29 NOTE — Progress Notes (Signed)
 Virtual Visit Consent   Angel Kim, you are scheduled for a virtual visit with a [facility] provider today. Just as with appointments in the office, your consent must be obtained to participate. Your consent will be active for this visit and any virtual visit you may have with one of our providers in the next 365 days. If you have a MyChart account, a copy of this consent can be sent to you electronically.  As this is a virtual visit, video technology does not allow for your provider to perform a traditional examination. This may limit your provider's ability to fully assess your condition. If your provider identifies any concerns that need to be evaluated in person or the need to arrange testing (such as labs, EKG, etc.), we will make arrangements to do so. Although advances in technology are sophisticated, we cannot ensure that it will always work on either your end or our end. If the connection with a video visit is poor, the visit may have to be switched to a telephone visit. With either a video or telephone visit, we are not always able to ensure that we have a secure connection.  By engaging in this virtual visit, you consent to the provision of healthcare and authorize for your insurance to be billed (if applicable) for the services provided during this visit. Depending on your insurance coverage, you may receive a charge related to this service.  I need to obtain your verbal consent now. Are you willing to proceed with your visit today? Angel Kim has provided verbal consent on 05/29/2024 for a virtual visit (video or telephone). Angel CHRISTELLA Dickinson, PA-C  Date: 05/29/2024 9:19 AM   Virtual Visit via Video Note   I, Angel Kim, connected with  Angel Kim  ([id number], [birth year]) on 05/29/24 at  9:00 AM EDT by a video-enabled telemedicine application and verified that I am speaking with the correct person using two identifiers.  Location: Patient: Virtual Visit Location  Patient: Home Provider: Virtual Visit Location Provider: Home Office   I discussed the limitations of evaluation and management by telemedicine and the availability of in person appointments. The patient expressed understanding and agreed to proceed.    History of Present Illness: Angel Kim is a 83 y.o. who identifies as a female who was assigned female at birth, and is being seen today for URI.  HPI: URI  This is a new problem. The current episode started in the past 7 days. The problem has been gradually worsening. Maximum temperature: 99 this morning. The fever has been present for 1 to 2 days. Associated symptoms include congestion, coughing, headaches, nausea, a plugged ear sensation, rhinorrhea, sinus pain, a sore throat and swollen glands. Pertinent negatives include no diarrhea, ear pain, vomiting or wheezing. Associated symptoms comments: chills. She has tried antihistamine and acetaminophen  (benadryl, tessalon ) for the symptoms. The treatment provided mild relief.     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   Estrogen deficiency 09/03/2023   Need for immunization against influenza 09/03/2023   Abnormal EKG 06/10/2023   Multiple kidney stones 02/19/2022   Hyperlipidemia LDL goal <100 02/15/2022   Primary hypertension 12/28/2021    Allergies:  Allergies  Allergen Reactions   Ciprofloxacin Hives, Itching and Rash  Ofloxacin Hives, Itching and Rash   Medications:  Current Outpatient Medications:    amoxicillin -clavulanate (AUGMENTIN ) 875-125 MG tablet, Take 1 tablet by mouth 2 (two) times daily., Disp: 20 tablet, Rfl: 0   benzonatate  (TESSALON ) 100 MG capsule, Take 1 capsule (100 mg total) by mouth 3 (three)  times daily as needed., Disp: 30 capsule, Rfl: 0   ondansetron  (ZOFRAN -ODT) 4 MG disintegrating tablet, Take 1 tablet (4 mg total) by mouth every 8 (eight) hours as needed., Disp: 20 tablet, Rfl: 0   acetaminophen  (TYLENOL ) 500 MG tablet, Take 500-1,000 mg by mouth every 6 (six) hours as needed for moderate pain (pain score 4-6)., Disp: , Rfl:    amLODipine  (NORVASC ) 2.5 MG tablet, Take 1 tablet (2.5 mg total) by mouth daily., Disp: 90 tablet, Rfl: 3   atorvastatin  (LIPITOR) 40 MG tablet, TAKE 1 TABLET BY MOUTH EVERY DAY, Disp: 90 tablet, Rfl: 1   estradiol  (ESTRACE ) 0.5 MG tablet, TAKE 1 TABLET BY MOUTH EVERY DAY, Disp: 90 tablet, Rfl: 1   Fexofenadine  HCl (ALLEGRA  ALLERGY PO), Take 1 tablet by mouth daily., Disp: , Rfl:    indapamide  (LOZOL ) 1.25 MG tablet, Take 0.5 tablets (0.625 mg total) by mouth daily., Disp: , Rfl:    metoprolol  tartrate (LOPRESSOR ) 50 MG tablet, TAKE 1 TABLET BY MOUTH TWICE A DAY, Disp: 180 tablet, Rfl: 0   Multiple Vitamin (MULTIVITAMIN ADULT PO), Take 1 capsule by mouth daily., Disp: , Rfl:    traZODone  (DESYREL ) 50 MG tablet, Take 0.5 tablets (25 mg total) by mouth at bedtime as needed for sleep., Disp: 45 tablet, Rfl: 1  Observations/Objective: Patient is well-developed, well-nourished in no acute distress.  Resting comfortably at home.  Head is normocephalic, atraumatic.  No labored breathing.  Speech is clear and coherent with logical content.  Patient is alert and oriented at baseline.    Assessment and Plan: 1. Bacterial upper respiratory infection (Primary) - benzonatate  (TESSALON ) 100 MG capsule; Take 1 capsule (100 mg total) by mouth 3 (three) times daily as needed.  Dispense: 30 capsule; Refill: 0 - ondansetron  (ZOFRAN -ODT) 4 MG disintegrating tablet; Take 1 tablet (4 mg total) by mouth every 8 (eight) hours as needed.  Dispense: 20 tablet; Refill: 0 - amoxicillin -clavulanate (AUGMENTIN ) 875-125 MG tablet; Take 1 tablet by mouth 2 (two) times daily.   Dispense: 20 tablet; Refill: 0  - Worsening over a week despite OTC medications - Will treat with Augmentin  and tessalon  perles - Zofran  for nausea - Can add Mucinex (PLAIN) - Push fluids.  - Rest.  - Steam and humidifier can help - Seek in person evaluation if worsening or symptoms fail to improve    Follow Up Instructions: I discussed the assessment and treatment plan with the patient. The patient was provided an opportunity to ask questions and all were answered. The patient agreed with the plan and demonstrated an understanding of the instructions.  A copy of instructions were sent to the patient via MyChart unless otherwise noted below.    The patient was advised to call back or seek an in-person evaluation if the symptoms worsen or if the condition fails to improve as anticipated.    Angel CHRISTELLA Dickinson, PA-C

## 2024-05-29 NOTE — Patient Instructions (Signed)
 Angel Kim, thank you for joining [facility] CHRISTELLA Dickinson, PA-C for today's virtual visit.  While this provider is not your primary care provider (PCP), if your PCP is located in our provider database this encounter information will be shared with them immediately following your visit.   A [facility] MyChart account gives you access to today's visit and all your visits, tests, and labs performed at [facility]  click here if you don't have a [facility] MyChart account or go to [email or web address]  Consent: (Patient) Angel Kim provided verbal consent for this virtual visit at the beginning of the encounter.  Current Medications:  Current Outpatient Medications:    amoxicillin -clavulanate (AUGMENTIN ) 875-125 MG tablet, Take 1 tablet by mouth 2 (two) times daily., Disp: 20 tablet, Rfl: 0   benzonatate  (TESSALON ) 100 MG capsule, Take 1 capsule (100 mg total) by mouth 3 (three) times daily as needed., Disp: 30 capsule, Rfl: 0   ondansetron  (ZOFRAN -ODT) 4 MG disintegrating tablet, Take 1 tablet (4 mg total) by mouth every 8 (eight) hours as needed., Disp: 20 tablet, Rfl: 0   acetaminophen  (TYLENOL ) 500 MG tablet, Take 500-1,000 mg by mouth every 6 (six) hours as needed for moderate pain (pain score 4-6)., Disp: , Rfl:    amLODipine  (NORVASC ) 2.5 MG tablet, Take 1 tablet (2.5 mg total) by mouth daily., Disp: 90 tablet, Rfl: 3   atorvastatin  (LIPITOR) 40 MG tablet, TAKE 1 TABLET BY MOUTH EVERY DAY, Disp: 90 tablet, Rfl: 1   estradiol  (ESTRACE ) 0.5 MG tablet, TAKE 1 TABLET BY MOUTH EVERY DAY, Disp: 90 tablet, Rfl: 1   Fexofenadine  HCl (ALLEGRA  ALLERGY PO), Take 1 tablet by mouth daily., Disp: , Rfl:    indapamide  (LOZOL ) 1.25 MG tablet, Take 0.5 tablets (0.625 mg total) by mouth daily., Disp: , Rfl:    metoprolol  tartrate (LOPRESSOR ) 50 MG tablet, TAKE 1 TABLET BY MOUTH TWICE A DAY, Disp: 180 tablet, Rfl: 0   Multiple Vitamin (MULTIVITAMIN ADULT PO), Take 1 capsule by mouth  daily., Disp: , Rfl:    traZODone  (DESYREL ) 50 MG tablet, Take 0.5 tablets (25 mg total) by mouth at bedtime as needed for sleep., Disp: 45 tablet, Rfl: 1   Medications ordered in this encounter:  Meds ordered this encounter  Medications   benzonatate  (TESSALON ) 100 MG capsule    Sig: Take 1 capsule (100 mg total) by mouth 3 (three) times daily as needed.    Dispense:  30 capsule    Refill:  0    Supervising Provider:   LAMPTEY, PHILIP O [[id number]]   ondansetron  (ZOFRAN -ODT) 4 MG disintegrating tablet    Sig: Take 1 tablet (4 mg total) by mouth every 8 (eight) hours as needed.    Dispense:  20 tablet    Refill:  0    Supervising Provider:   LAMPTEY, PHILIP O [[id number]]   amoxicillin -clavulanate (AUGMENTIN ) 875-125 MG tablet    Sig: Take 1 tablet by mouth 2 (two) times daily.    Dispense:  20 tablet    Refill:  0    Supervising Provider:   LAMPTEY, PHILIP O [[id number]]     *If you need refills on other medications prior to your next appointment, please contact your pharmacy*  Follow-Up: Call back or seek an in-person evaluation if the symptoms worsen or if the condition fails to improve as anticipated.  [facility] [phone]  Other Instructions Upper Respiratory Infection, Adult An upper respiratory infection (URI) is a common viral infection  of the nose, throat, and upper air passages that lead to the lungs. The most common type of URI is the common cold. URIs usually get better on their own, without medical treatment. What are the causes? A URI is caused by a virus. You may catch a virus by: Breathing in droplets from an infected person's cough or sneeze. Touching something that has been exposed to the virus (is contaminated) and then touching your mouth, nose, or eyes. What increases the risk? You are more likely to get a URI if: You are very young or very old. You have close contact with others, such as at work, school, or a health care facility. You  smoke. You have long-term (chronic) heart or lung disease. You have a weakened disease-fighting system (immune system). You have nasal allergies or asthma. You are experiencing a lot of stress. You have poor nutrition. What are the signs or symptoms? A URI usually involves some of the following symptoms: Runny or stuffy (congested) nose. Cough. Sneezing. Sore throat. Headache. Fatigue. Fever. Loss of appetite. Pain in your forehead, behind your eyes, and over your cheekbones (sinus pain). Muscle aches. Redness or irritation of the eyes. Pressure in the ears or face. How is this diagnosed? This condition may be diagnosed based on your medical history and symptoms, and a physical exam. Your health care provider may use a swab to take a mucus sample from your nose (nasal swab). This sample can be tested to determine what virus is causing the illness. How is this treated? URIs usually get better on their own within 7-10 days. Medicines cannot cure URIs, but your health care provider may recommend certain medicines to help relieve symptoms, such as: Over-the-counter cold medicines. Cough suppressants. Coughing is a type of defense against infection that helps to clear the respiratory system, so take these medicines only as recommended by your health care provider. Fever-reducing medicines. Follow these instructions at home: Activity Rest as needed. If you have a fever, stay home from work or school until your fever is gone or until your health care provider says your URI cannot spread to other people (is no longer contagious). Your health care provider may have you wear a face mask to prevent your infection from spreading. Relieving symptoms Gargle with a mixture of salt and water 3-4 times a day or as needed. To make salt water, completely dissolve -1 tsp (3-6 g) of salt in 1 cup (237 mL) of warm water. Use a cool-mist humidifier to add moisture to the air. This can help you breathe  more easily. Eating and drinking  Drink enough fluid to keep your urine pale yellow. Eat soups and other clear broths. General instructions  Take over-the-counter and prescription medicines only as told by your health care provider. These include cold medicines, fever reducers, and cough suppressants. Do not use any products that contain nicotine or tobacco. These products include cigarettes, chewing tobacco, and vaping devices, such as e-cigarettes. If you need help quitting, ask your health care provider. Stay away from secondhand smoke. Stay up to date on all immunizations, including the yearly (annual) flu vaccine. Keep all follow-up visits. This is important. How to prevent the spread of infection to others URIs can be contagious. To prevent the infection from spreading: Wash your hands with soap and water for at least 20 seconds. If soap and water are not available, use hand sanitizer. Avoid touching your mouth, face, eyes, or nose. Cough or sneeze into a tissue or your sleeve  or elbow instead of into your hand or into the air.  Contact a health care provider if: You are getting worse instead of better. You have a fever or chills. Your mucus is brown or red. You have yellow or brown discharge coming from your nose. You have pain in your face, especially when you bend forward. You have swollen neck glands. You have pain while swallowing. You have white areas in the back of your throat. Get help right away if: You have shortness of breath that gets worse. You have severe or persistent: Headache. Ear pain. Sinus pain. Chest pain. You have chronic lung disease along with any of the following: Making high-pitched whistling sounds when you breathe, most often when you breathe out (wheezing). Prolonged cough (more than 14 days). Coughing up blood. A change in your usual mucus. You have a stiff neck. You have changes in your: Vision. Hearing. Thinking. Mood. These symptoms  may be an emergency. Get help right away. Call 911. Do not wait to see if the symptoms will go away. Do not drive yourself to the hospital. Summary An upper respiratory infection (URI) is a common infection of the nose, throat, and upper air passages that lead to the lungs. A URI is caused by a virus. URIs usually get better on their own within 7-10 days. Medicines cannot cure URIs, but your health care provider may recommend certain medicines to help relieve symptoms. This information is not intended to replace advice given to you by your health care provider. Make sure you discuss any questions you have with your health care provider. Document Revised: 05/17/2021 Document Reviewed: 05/17/2021 Elsevier Patient Education  2024 [employer]   If you have been instructed to have an in-person evaluation today at a local Urgent Care facility, please use the link below. It will take you to a list of all of our available [facility], including address, phone number and hours of operation. Please do not delay care.  [facility]  If you or a family member do not have a primary care provider, use the link below to schedule a visit and establish care. When you choose a [facility] primary care physician or advanced practice provider, you gain a long-term partner in health. Find a Primary Care Provider  Learn more about [facility]'s in-office and virtual care options: [facility] - Get Care Now

## 2024-06-08 ENCOUNTER — Other Ambulatory Visit

## 2024-06-12 ENCOUNTER — Encounter (HOSPITAL_BASED_OUTPATIENT_CLINIC_OR_DEPARTMENT_OTHER): Payer: Self-pay

## 2024-06-12 ENCOUNTER — Inpatient Hospital Stay (HOSPITAL_BASED_OUTPATIENT_CLINIC_OR_DEPARTMENT_OTHER): Admission: RE | Admit: 2024-06-12 | Source: Ambulatory Visit

## 2024-06-23 ENCOUNTER — Telehealth: Payer: Self-pay

## 2024-06-23 ENCOUNTER — Other Ambulatory Visit: Admitting: Pharmacist

## 2024-06-23 DIAGNOSIS — I10 Essential (primary) hypertension: Secondary | ICD-10-CM

## 2024-06-23 NOTE — Progress Notes (Signed)
 06/23/2024 Name: Angel Kim MRN: [id number] DOB: [birth year]  Chief Complaint  Patient presents with   Hypertension   Medication Management    Angel Kim is a 83 y.o. year old female who presented for a telephone visit.   They were referred to the pharmacist by their PCP for assistance in managing hypertension.   Subjective:  Care Team: Primary Care Provider: Joshua Debby CROME, MD ; Next Scheduled Visit: 08/18/2024 Cardiologist: Dr. Kate; Next Scheduled Visit: 08/24/2024  Medication Access/Adherence  Current Pharmacy:  [id number] - [place], [place] - [address] [place] [address] Phone: [phone] Fax: [phone]   Patient reports affordability concerns with their medications: No  Patient reports access/transportation concerns to their pharmacy: No  Patient reports adherence concerns with their medications:  No     Hypertension:  Current medications: Amlodipine  2.5mg  daily, Indapamide  1.25 mg 0.5 tablet daily. Metoprolol  50mg   BID  Medications previously tried: Olmesartan  (d/c during hospitalization due to increase in Scr after cardiac cath) - Indapamide  was decreased during hospitalization due to bump in Scr after cardiac cath   Patient has a validated, automated, upper arm home BP cuff Current blood pressure readings readings: 135-145/78-85   Patient denies hypotensive s/sx including dizziness, lightheadedness.  Patient reports hypertensive symptoms including headache, chest pain, shortness of breath (She stated she has headaches and slight chest pains occasionally).    Objective:  Lab Results  Component Value Date   HGBA1C 5.3 05/01/2024    Lab Results  Component Value Date   CREATININE 0.82 05/01/2024   BUN 16 05/01/2024   NA 137 05/01/2024   K 3.7 05/01/2024   CL 107 05/01/2024   CO2 25 05/01/2024    Lab Results  Component Value Date   CHOL 163 05/01/2024   HDL 47 05/01/2024   LDLCALC 89 05/01/2024    LDLDIRECT 94.0 04/29/2023   TRIG 133 05/01/2024   CHOLHDL 3.5 05/01/2024    Medications Reviewed Today     Reviewed by Merceda Lela SAUNDERS, RPH (Pharmacist) on 06/23/24 at 1452  Med List Status: <None>   Medication Order Taking? Sig Documenting Provider Last Dose Status Informant  acetaminophen  (TYLENOL ) 500 MG tablet [id number]  Take 500-1,000 mg by mouth every 6 (six) hours as needed for moderate pain (pain score 4-6). [provider]  Active Self, Pharmacy Records  amLODipine  (NORVASC ) 2.5 MG tablet [id number] Yes Take 1 tablet (2.5 mg total) by mouth daily. Darryle Thom CROME, PA-C  Active     Discontinued 06/23/24 1437 (Completed Course)   atorvastatin  (LIPITOR) 40 MG tablet [id number] Yes TAKE 1 TABLET BY MOUTH EVERY DAY Joshua Debby CROME, MD  Active Self, Pharmacy Records    Discontinued 06/23/24 1437 (No longer needed (for PRN medications))   estradiol  (ESTRACE ) 0.5 MG tablet [id number] Yes TAKE 1 TABLET BY MOUTH EVERY DAY Joshua Debby CROME, MD  Active Self, Pharmacy Records  Fexofenadine  HCl (ALLEGRA  ALLERGY PO) [id number] Yes Take 1 tablet by mouth daily. [provider]  Active Self, Pharmacy Records  indapamide  (LOZOL ) 1.25 MG tablet [id number] Yes Take 0.5 tablets (0.625 mg total) by mouth daily. Ladona Heinz, MD  Active   metoprolol  tartrate (LOPRESSOR ) 50 MG tablet [id number] Yes TAKE 1 TABLET BY MOUTH TWICE A DAY Joshua Debby CROME, MD  Active Self, Pharmacy Records  Multiple Vitamin (MULTIVITAMIN ADULT PO) [id number] Yes Take 1 capsule by mouth daily. [provider]  Active Self, Pharmacy Records  ondansetron  (ZOFRAN -ODT) 4 MG disintegrating  tablet [id number]  Take 1 tablet (4 mg total) by mouth every 8 (eight) hours as needed. Vivienne Nest M, PA-C  Active   traZODone  (DESYREL ) 50 MG tablet [id number] Yes Take 0.5 tablets (25 mg total) by mouth at bedtime as needed for sleep. Joshua Debby CROME, MD  Active               Assessment/Plan:   Hypertension: -  Currently uncontrolled Goal BP < 130/80  - Recommend to continue monitoring blood pressure at home  - BMP in office on September 10th to follow up on kidney function, if remaining WNL, increase indapamide  back to 1.25 mg daily to target average BP <130/80   Follow Up Plan: Call with results from lab work.   Lum Ricks, PharmD Candidate  [facility] [employer]    Darrelyn Drum, PharmD, [employer], CPP Clinical Pharmacist Practitioner [facility] [phone]

## 2024-06-23 NOTE — Telephone Encounter (Signed)
 Copied from CRM [id number]. Topic: Clinical - Request for Lab/Test Order >> Jun 23, 2024  2:03 PM Angel Kim wrote: Reason for CRM: patient called stating she was checking on her bone density appointment. The patient stated that the breast center does not do them anymore so they gave her another number to call and she could not get anyone. I called the number that they gave the patient and [facility] told me the patient needs a new order because the one she had expired  CB for patient and she said do not call at 2:30pm today because she has a meeting. [phone] [phone]

## 2024-06-23 NOTE — Patient Instructions (Signed)
 It was a pleasure speaking with you today!  Continue your current medications.  Come to the lab on September 10th for follow-up lab work. I will call you with the results.   Feel free to call with any questions or concerns!  Lum Ricks, PharmD Candidate  [employer], [employer]    Darrelyn Drum, PharmD, [employer], CPP Clinical Pharmacist Practitioner [facility] at [facility] [phone]

## 2024-06-24 ENCOUNTER — Other Ambulatory Visit: Payer: Self-pay | Admitting: Internal Medicine

## 2024-06-24 DIAGNOSIS — E2839 Other primary ovarian failure: Secondary | ICD-10-CM

## 2024-06-24 NOTE — Telephone Encounter (Signed)
Can you place a new order?

## 2024-07-13 ENCOUNTER — Ambulatory Visit (INDEPENDENT_AMBULATORY_CARE_PROVIDER_SITE_OTHER)
Admission: RE | Admit: 2024-07-13 | Discharge: 2024-07-13 | Disposition: A | Discharge status: Home / Self Care | Source: Ambulatory Visit | Attending: Internal Medicine | Admitting: Internal Medicine

## 2024-07-13 DIAGNOSIS — E2839 Other primary ovarian failure: Secondary | ICD-10-CM

## 2024-07-17 ENCOUNTER — Ambulatory Visit: Payer: Self-pay | Admitting: Internal Medicine

## 2024-07-19 ENCOUNTER — Other Ambulatory Visit: Payer: Self-pay | Admitting: Internal Medicine

## 2024-07-19 DIAGNOSIS — R000 Tachycardia, unspecified: Secondary | ICD-10-CM

## 2024-07-19 DIAGNOSIS — I10 Essential (primary) hypertension: Secondary | ICD-10-CM

## 2024-08-12 ENCOUNTER — Other Ambulatory Visit: Payer: Self-pay | Admitting: Internal Medicine

## 2024-08-12 DIAGNOSIS — N952 Postmenopausal atrophic vaginitis: Secondary | ICD-10-CM

## 2024-08-18 ENCOUNTER — Ambulatory Visit: Admitting: Internal Medicine

## 2024-08-18 ENCOUNTER — Encounter: Payer: Self-pay | Admitting: Internal Medicine

## 2024-08-18 VITALS — BP 160/80 | HR 60 | Temp 97.8°F | Ht 65.0 in | Wt 138.0 lb

## 2024-08-18 DIAGNOSIS — I10 Essential (primary) hypertension: Secondary | ICD-10-CM | POA: Diagnosis not present

## 2024-08-18 DIAGNOSIS — N1832 Chronic kidney disease, stage 3b: Secondary | ICD-10-CM | POA: Insufficient documentation

## 2024-08-18 MED ORDER — AMLODIPINE BESYLATE 5 MG PO TABS: 5.0000 mg | ORAL_TABLET | Freq: Every day | ORAL | 0 refills | Status: DC

## 2024-08-18 MED ORDER — INDAPAMIDE 1.25 MG PO TABS: 1.2500 mg | ORAL_TABLET | Freq: Every day | ORAL | 0 refills | Status: DC

## 2024-08-18 NOTE — Patient Instructions (Signed)
 Hypertension, Adult High blood pressure (hypertension) is when the force of blood pumping through the arteries is too strong. The arteries are the blood vessels that carry blood from the heart throughout the body. Hypertension forces the heart to work harder to pump blood and may cause arteries to become narrow or stiff. Untreated or uncontrolled hypertension can lead to a heart attack, heart failure, a stroke, kidney disease, and other problems. A blood pressure reading consists of a higher number over a lower number. Ideally, your blood pressure should be below 120/80. The first ("top") number is called the systolic pressure. It is a measure of the pressure in your arteries as your heart beats. The second ("bottom") number is called the diastolic pressure. It is a measure of the pressure in your arteries as the heart relaxes. What are the causes? The exact cause of this condition is not known. There are some conditions that result in high blood pressure. What increases the risk? Certain factors may make you more likely to develop high blood pressure. Some of these risk factors are under your control, including: Smoking. Not getting enough exercise or physical activity. Being overweight. Having too much fat, sugar, calories, or salt (sodium) in your diet. Drinking too much alcohol. Other risk factors include: Having a personal history of heart disease, diabetes, high cholesterol, or kidney disease. Stress. Having a family history of high blood pressure and high cholesterol. Having obstructive sleep apnea. Age. The risk increases with age. What are the signs or symptoms? High blood pressure may not cause symptoms. Very high blood pressure (hypertensive crisis) may cause: Headache. Fast or irregular heartbeats (palpitations). Shortness of breath. Nosebleed. Nausea and vomiting. Vision changes. Severe chest pain, dizziness, and seizures. How is this diagnosed? This condition is diagnosed by  measuring your blood pressure while you are seated, with your arm resting on a flat surface, your legs uncrossed, and your feet flat on the floor. The cuff of the blood pressure monitor will be placed directly against the skin of your upper arm at the level of your heart. Blood pressure should be measured at least twice using the same arm. Certain conditions can cause a difference in blood pressure between your right and left arms. If you have a high blood pressure reading during one visit or you have normal blood pressure with other risk factors, you may be asked to: Return on a different day to have your blood pressure checked again. Monitor your blood pressure at home for 1 week or longer. If you are diagnosed with hypertension, you may have other blood or imaging tests to help your health care provider understand your overall risk for other conditions. How is this treated? This condition is treated by making healthy lifestyle changes, such as eating healthy foods, exercising more, and reducing your alcohol intake. You may be referred for counseling on a healthy diet and physical activity. Your health care provider may prescribe medicine if lifestyle changes are not enough to get your blood pressure under control and if: Your systolic blood pressure is above 130. Your diastolic blood pressure is above 80. Your personal target blood pressure may vary depending on your medical conditions, your age, and other factors. Follow these instructions at home: Eating and drinking  Eat a diet that is high in fiber and potassium, and low in sodium, added sugar, and fat. An example of this eating plan is called the DASH diet. DASH stands for Dietary Approaches to Stop Hypertension. To eat this way: Eat  plenty of fresh fruits and vegetables. Try to fill one half of your plate at each meal with fruits and vegetables. Eat whole grains, such as whole-wheat pasta, brown rice, or whole-grain bread. Fill about one  fourth of your plate with whole grains. Eat or drink low-fat dairy products, such as skim milk or low-fat yogurt. Avoid fatty cuts of meat, processed or cured meats, and poultry with skin. Fill about one fourth of your plate with lean proteins, such as fish, chicken without skin, beans, eggs, or tofu. Avoid pre-made and processed foods. These tend to be higher in sodium, added sugar, and fat. Reduce your daily sodium intake. Many people with hypertension should eat less than 1,500 mg of sodium a day. Do not drink alcohol if: Your health care provider tells you not to drink. You are pregnant, may be pregnant, or are planning to become pregnant. If you drink alcohol: Limit how much you have to: 0-1 drink a day for women. 0-2 drinks a day for men. Know how much alcohol is in your drink. In the [place] one drink equals one 12 oz bottle of beer (355 mL), one 5 oz glass of wine (148 mL), or one 1 oz glass of hard liquor (44 mL). Lifestyle  Work with your health care provider to maintain a healthy body weight or to lose weight. Ask what an ideal weight is for you. Get at least 30 minutes of exercise that causes your heart to beat faster (aerobic exercise) most days of the week. Activities may include walking, swimming, or biking. Include exercise to strengthen your muscles (resistance exercise), such as Pilates or lifting weights, as part of your weekly exercise routine. Try to do these types of exercises for 30 minutes at least 3 days a week. Do not use any products that contain nicotine or tobacco. These products include cigarettes, chewing tobacco, and vaping devices, such as e-cigarettes. If you need help quitting, ask your health care provider. Monitor your blood pressure at home as told by your health care provider. Keep all follow-up visits. This is important. Medicines Take over-the-counter and prescription medicines only as told by your health care provider. Follow directions carefully. Blood  pressure medicines must be taken as prescribed. Do not skip doses of blood pressure medicine. Doing this puts you at risk for problems and can make the medicine less effective. Ask your health care provider about side effects or reactions to medicines that you should watch for. Contact a health care provider if you: Think you are having a reaction to a medicine you are taking. Have headaches that keep coming back (recurring). Feel dizzy. Have swelling in your ankles. Have trouble with your vision. Get help right away if you: Develop a severe headache or confusion. Have unusual weakness or numbness. Feel faint. Have severe pain in your chest or abdomen. Vomit repeatedly. Have trouble breathing. These symptoms may be an emergency. Get help right away. Call 911. Do not wait to see if the symptoms will go away. Do not drive yourself to the hospital. Summary Hypertension is when the force of blood pumping through your arteries is too strong. If this condition is not controlled, it may put you at risk for serious complications. Your personal target blood pressure may vary depending on your medical conditions, your age, and other factors. For most people, a normal blood pressure is less than 120/80. Hypertension is treated with lifestyle changes, medicines, or a combination of both. Lifestyle changes include losing weight, eating a healthy,  low-sodium diet, exercising more, and limiting alcohol. This information is not intended to replace advice given to you by your health care provider. Make sure you discuss any questions you have with your health care provider. Document Revised: 08/22/2021 Document Reviewed: 08/22/2021 Elsevier Patient Education  2024 [employer].

## 2024-08-18 NOTE — Progress Notes (Signed)
 Subjective:  Patient ID: Angel Kim, female    DOB: 06-Feb-1941  Age: 83 y.o. MRN: [id number]  CC: Hypertension   HPI Angel Kim presents for f/up ---  Discussed the use of AI scribe software for clinical note transcription with the patient, who gave verbal consent to proceed.  History of Present Illness Angel Kim is an 83 year old female with hypertension who presents with shoulder pain and dizziness.  She has been experiencing shoulder pain, which she is managing with extra strength Tylenol . She has received two corticosteroid injections in the shoulder, the most recent one administered two weeks ago by an orthopedic surgeon's PA. The first injection, given about a year ago, provided relief. An x-ray revealed a calcium  deposit in the joint.  She experiences occasional chest pain, primarily at rest, which is not severe enough to require medication. She has a history of premature ventricular beats but no recent palpitations. She reports dizziness and lightheadedness, but no swelling in her legs or feet. She notes that her balance is not as good as it once was. No headaches or blurred vision.  She was taken off low-dose aspirin  by her cardiologist during a hospital stay and has not resumed it. She is currently on medication for high blood pressure, which she believes is not causing any side effects. She received a flu shot yesterday and a COVID vaccine about two to three weeks ago.     Outpatient Medications Prior to Visit  Medication Sig Dispense Refill   acetaminophen  (TYLENOL ) 500 MG tablet Take 500-1,000 mg by mouth every 6 (six) hours as needed for moderate pain (pain score 4-6).     atorvastatin  (LIPITOR) 40 MG tablet TAKE 1 TABLET BY MOUTH EVERY DAY 90 tablet 1   estradiol  (ESTRACE ) 0.5 MG tablet TAKE 1 TABLET BY MOUTH EVERY DAY 90 tablet 1   Fexofenadine  HCl (ALLEGRA  ALLERGY PO) Take 1 tablet by mouth daily.     metoprolol  tartrate (LOPRESSOR ) 50 MG tablet TAKE 1  TABLET BY MOUTH TWICE A DAY 180 tablet 0   Multiple Vitamin (MULTIVITAMIN ADULT PO) Take 1 capsule by mouth daily.     traZODone  (DESYREL ) 50 MG tablet Take 0.5 tablets (25 mg total) by mouth at bedtime as needed for sleep. 45 tablet 1   amLODipine  (NORVASC ) 2.5 MG tablet Take 1 tablet (2.5 mg total) by mouth daily. 90 tablet 3   indapamide  (LOZOL ) 1.25 MG tablet Take 0.5 tablets (0.625 mg total) by mouth daily.     ondansetron  (ZOFRAN -ODT) 4 MG disintegrating tablet Take 1 tablet (4 mg total) by mouth every 8 (eight) hours as needed. 20 tablet 0   No facility-administered medications prior to visit.    ROS Review of Systems  Constitutional:  Negative for appetite change, chills, diaphoresis, fatigue and fever.  HENT: Negative.  Negative for sore throat and trouble swallowing.   Eyes: Negative.   Respiratory:  Negative for cough, chest tightness, shortness of breath and wheezing.   Cardiovascular:  Positive for chest pain. Negative for palpitations and leg swelling.  Gastrointestinal: Negative.  Negative for abdominal pain, constipation, diarrhea, nausea and vomiting.  Endocrine: Negative.   Genitourinary: Negative.  Negative for difficulty urinating.  Musculoskeletal:  Positive for arthralgias. Negative for myalgias.  Skin: Negative.   Neurological:  Positive for dizziness and light-headedness. Negative for weakness, numbness and headaches.  Hematological:  Negative for adenopathy. Does not bruise/bleed easily.  Psychiatric/Behavioral: Negative.      Objective:  BP ROLLEN)  160/80 (BP Location: Left Arm, Patient Position: Sitting, Cuff Size: Normal)   Pulse 60   Temp 97.8 F (36.6 C) (Oral)   Ht 5' 5 (1.651 m)   Wt 138 lb (62.6 kg)   SpO2 95%   BMI 22.96 kg/m   BP Readings from Last 3 Encounters:  08/18/24 (!) 160/80  05/18/24 (!) 146/86  05/11/24 (!) 176/71    Wt Readings from Last 3 Encounters:  08/18/24 138 lb (62.6 kg)  05/18/24 137 lb 6.4 oz (62.3 kg)  05/11/24 137 lb  3.2 oz (62.2 kg)    Physical Exam Vitals reviewed.  Constitutional:      Appearance: Normal appearance.  HENT:     Nose: Nose normal.     Mouth/Throat:     Mouth: Mucous membranes are moist.  Eyes:     General: No scleral icterus.    Conjunctiva/sclera: Conjunctivae normal.  Cardiovascular:     Rate and Rhythm: Normal rate and regular rhythm.     Heart sounds: No murmur heard.    No friction rub. No gallop.  Pulmonary:     Effort: Pulmonary effort is normal.     Breath sounds: No stridor. No wheezing, rhonchi or rales.  Chest:     Chest wall: No tenderness.  Abdominal:     General: Abdomen is flat.     Palpations: There is no mass.     Tenderness: There is no abdominal tenderness. There is no guarding.     Hernia: No hernia is present.  Musculoskeletal:        General: Normal range of motion.     Cervical back: Neck supple.     Right lower leg: No edema.     Left lower leg: No edema.  Lymphadenopathy:     Cervical: No cervical adenopathy.  Skin:    General: Skin is warm and dry.  Neurological:     General: No focal deficit present.     Mental Status: She is alert.  Psychiatric:        Mood and Affect: Mood normal.        Behavior: Behavior normal.     Lab Results  Component Value Date   WBC 7.7 05/01/2024   HGB 12.8 05/01/2024   HCT 38.0 05/01/2024   PLT 228 05/01/2024   GLUCOSE 93 05/01/2024   CHOL 163 05/01/2024   TRIG 133 05/01/2024   HDL 47 05/01/2024   LDLDIRECT 94.0 04/29/2023   LDLCALC 89 05/01/2024   ALT 27 04/27/2024   AST 28 04/27/2024   NA 137 05/01/2024   K 3.7 05/01/2024   CL 107 05/01/2024   CREATININE 0.82 05/01/2024   BUN 16 05/01/2024   CO2 25 05/01/2024   TSH 2.86 05/18/2024   HGBA1C 5.3 05/01/2024    DG Bone Density Result Date: 07/17/2024 Table formatting from the original result was not included. Date of study: 07/13/2024 Exam: DUAL X-RAY ABSORPTIOMETRY (DXA) FOR BONE MINERAL DENSITY (BMD) Instrument: [employer]  Requesting Provider: PCP Indication: follow up for low BMD Comparison: none (please note that it is not possible to compare data from different instruments) Clinical data: Pt is a 83 y.o. female without previous history of fragility (osteoporotic) fracture. On calcium , vitamin D, estrogen tablet. Results:  Lumbar spine L1- L3 (L4) Femoral neck (FN) 33% distal radius T-score -0.6 RFN: -2.1 LFN: -1.5 n/a Assessment: the BMD is low according to the [facility] classification for osteoporosis (see below). Fracture risk: moderate - high FRAX score: 10  year major osteoporotic risk: 15.4%. 10 year hip fracture risk: 5.0%. The thresholds for treatment are 20% and 3%, respectively. Comments: the technical quality of the study is good, however, L4 vertebra had to be excluded from analysis due to degenerative changes. Evaluation for secondary causes should be considered if clinically indicated. Recommend optimizing calcium  (1200 mg/day) and vitamin D (800 IU/day) intake. Followup: Repeat BMD is appropriate after 2 years. WHO criteria for diagnosis of osteoporosis in postmenopausal women and in men 79 y/o or older: - normal: T-score -1.0 to + 1.0 - osteopenia/low bone density: T-score between -2.5 and -1.0 - osteoporosis: T-score below -2.5 - severe osteoporosis: T-score below -2.5 with history of fragility fracture Note: although not part of the WHO classification, the presence of a fragility fracture, regardless of the T-score, should be considered diagnostic of osteoporosis, provided other causes for the fracture have been excluded. Treatment: The National Osteoporosis Foundation recommends that treatment be considered in postmenopausal women and men age 28 or older with: 1. Hip or vertebral (clinical or morphometric) fracture 2. T-score of - 2.5 or lower at the spine or hip 3. 10-year fracture probability by FRAX of at least 20% for a major osteoporotic fracture and 3% for a hip fracture Angel Fendt, MD [facility]     Assessment & Plan:   Primary hypertension- She has not achieved her BP goal. Will increase the doses of the CCB and diuretic. -     Indapamide ; Take 1 tablet (1.25 mg total) by mouth daily.  Dispense: 90 tablet; Refill: 0 -     amLODIPine  Besylate; Take 1 tablet (5 mg total) by mouth daily.  Dispense: 90 tablet; Refill: 0 -     AMB Referral VBCI Care Management     Follow-up: Return in about 3 months (around 11/18/2024).  Debby Molt, MD

## 2024-08-19 ENCOUNTER — Telehealth: Payer: Self-pay | Admitting: *Deleted

## 2024-08-19 NOTE — Progress Notes (Unsigned)
 Care Guide Pharmacy Note  08/19/2024 Name: Angel Kim MRN: [id number] DOB: 07-19-1941  Referred By: Joshua Debby CROME, MD Reason for referral: Call Attempt #1 and Complex Care Management (Outreach to schedule referral with pharmacist )   Angel Kim is a 83 y.o. year old female who is a primary care patient of Joshua Debby CROME, MD.  Angel Kim Knee was referred to the pharmacist for assistance related to: HTN  An unsuccessful telephone outreach was attempted today to contact the patient who was referred to the pharmacy team for assistance with medication management. Additional attempts will be made to contact the patient.  Angel Kim, CMA [place]  [facility], [facility] Guide Direct Dial: [phone]  Fax: [phone] Website: [email or web address]

## 2024-08-20 NOTE — Progress Notes (Unsigned)
 Care Guide Pharmacy Note  08/20/2024 Name: Angel Kim MRN: [id number] DOB: 1941/08/21  Referred By: Joshua Debby CROME, MD Reason for referral: Call Attempt #1 and Complex Care Management (Outreach to schedule referral with pharmacist )   Angel Kim is a 83 y.o. year old female who is a primary care patient of Joshua Debby CROME, MD.  Angel Kim was referred to the pharmacist for assistance related to: HTN  A second unsuccessful telephone outreach was attempted today to contact the patient who was referred to the pharmacy team for assistance with medication management. Additional attempts will be made to contact the patient.  Thedford Franks, CMA [place]  [facility], [facility] Guide Direct Dial: [phone]  Fax: [phone] Website: [email or web address]

## 2024-08-21 NOTE — Progress Notes (Signed)
 Care Guide Pharmacy Note  08/21/2024 Name: Angel Kim MRN: [id number] DOB: 1941/10/04  Referred By: Joshua Debby CROME, MD Reason for referral: Call Attempt #1 and Complex Care Management (Outreach to schedule referral with pharmacist )   Angel Kim is a 83 y.o. year old female who is a primary care patient of Joshua Debby CROME, MD.  [name] was referred to the pharmacist for assistance related to: HTN  A third unsuccessful telephone outreach was attempted today to contact the patient who was referred to the pharmacy team for assistance with medication management. The Population Health team is pleased to engage with this patient at any time in the future upon receipt of referral and should he/she be interested in assistance from the Population Health team.  Thedford Franks, CMA [facility]  [facility], [facility] Guide Direct Dial: [phone]  Fax: [phone] Website: [email or web address]

## 2024-08-21 NOTE — Progress Notes (Signed)
 Care Guide Pharmacy Note  08/21/2024 Name: [name] MRN: [id number] DOB: 10-09-1941  Referred By: Joshua Debby CROME, MD Reason for referral: Call Attempt #1 and Complex Care Management (Outreach to schedule referral with pharmacist )   Angel Kim is a 83 y.o. year old female who is a primary care patient of Joshua Debby CROME, MD.  [name] was referred to the pharmacist for assistance related to: HTN  Successful contact was made with the patient to discuss pharmacy services including being ready for the pharmacist to call at least 5 minutes before the scheduled appointment time and to have medication bottles and any blood pressure readings ready for review. The patient agreed to meet with the pharmacist via telephone visit on 08/27/2024  Thedford Franks, CMA [place]  [facility], [facility] Guide Direct Dial: [phone]  Fax: [phone] Website: [email or web address]

## 2024-08-23 NOTE — Progress Notes (Deleted)
 Cardiology Office Note:    Date:  08/23/2024   ID:  Oveta, Idris [birth year], MRN [id number]  PCP:  Joshua Debby CROME, MD  Cardiologist:  Lonni CROME Nanas, MD  Electrophysiologist:  None   Referring MD: Joshua Debby CROME, MD   No chief complaint on file. ***  History of Present Illness:    Angel Kim is a 83 y.o. female with a hx of hypertension, hyperlipidemia who presents for follow-up.  She was admitted 04/2024 with chest pain and mild troponin elevation.  She reported chest pain persisting for hours, troponin 148 > 119.  EKG showed ST depressions.  Echo 04/30/2024 showed normal biventricular function, no significant valvular disease.  LHC showed normal coronary arteries.  There was thought to be possible myocarditis given her recent COVID infection but cardiac MRI was not done.  Since last clinic visit,  Past Medical History:  Diagnosis Date   Allergy 1998   Drug allergy   Arthritis    Cataract 2019   Bilateral surgery 2019   Chronic kidney disease    CKD Stg 3   GERD (gastroesophageal reflux disease)    History of hiatal hernia    History of kidney stones    Hyperlipidemia 1990   On medication   Hypertension    Irregular heart rhythm    pt states has rapid rate at times, controlled with metoprolol    PONV (postoperative nausea and vomiting)     Past Surgical History:  Procedure Laterality Date   ABDOMINAL HYSTERECTOMY     APPENDECTOMY  2015   BREAST BIOPSY Right 2020   BREAST EXCISIONAL BIOPSY Left 2000   CYSTOSCOPY/URETEROSCOPY/HOLMIUM LASER/STENT PLACEMENT Bilateral 03/30/2022   Procedure: CYSTOSCOPY BILATERAL /URETEROSCOPY BILATERAL RETROGRADES CORRIN LASER/STENT PLACEMENT;  Surgeon: Watt Rush, MD;  Location: [facility];  Service: Urology;  Laterality: Bilateral;   LEFT HEART CATH AND CORONARY ANGIOGRAPHY N/A 04/30/2024   Procedure: LEFT HEART CATH AND CORONARY ANGIOGRAPHY;  Surgeon: Ladona Heinz, MD;  [facility];   Service: Cardiovascular;  Laterality: N/A;   REDUCTION MAMMAPLASTY  1970    Current Medications: No outpatient medications have been marked as taking for the 08/24/24 encounter (Appointment) with Nanas Lonni CROME, MD.     Allergies:   Ciprofloxacin and Ofloxacin   Social History   Socioeconomic History   Marital status: Widowed    Spouse name: Not on file   Number of children: Not on file   Years of education: Not on file   Highest education level: Associate degree: occupational, scientist, product/process development, or vocational program  Occupational History   Not on file  Tobacco Use   Smoking status: Former    Types: Cigarettes   Smokeless tobacco: Never  Substance and Sexual Activity   Alcohol use: Yes    Comment: occasional   Drug use: Never   Sexual activity: Not Currently    Partners: Male  Other Topics Concern   Not on file  Social History Narrative   Not on file   Social Drivers of Health   Financial Resource Strain: Low Risk  (08/11/2024)   Overall Financial Resource Strain (CARDIA)    Difficulty of Paying Living Expenses: Not very hard  Food Insecurity: No Food Insecurity (08/11/2024)   Hunger Vital Sign    Worried About Running Out of Food in the Last Year: Never true    Ran Out of Food in the Last Year: Never true  Transportation Needs: No Transportation Needs (08/11/2024)   PRAPARE -  Administrator, Civil Service (Medical): No    Lack of Transportation (Non-Medical): No  Physical Activity: Insufficiently Active (08/11/2024)   Exercise Vital Sign    Days of Exercise per Week: 1 day    Minutes of Exercise per Session: 30 min  Stress: Stress Concern Present (08/11/2024)   [employer] of Occupational Health - Occupational Stress Questionnaire    Feeling of Stress: To some extent  Social Connections: Moderately Integrated (08/11/2024)   Social Connection and Isolation Panel    Frequency of Communication with Friends and Family: More than three times a  week    Frequency of Social Gatherings with Friends and Family: Twice a week    Attends Religious Services: More than 4 times per year    Active Member of [employer] or Organizations: Yes    Attends Banker Meetings: More than 4 times per year    Marital Status: Widowed     Family History: The patient's ***family history includes Arthritis in her paternal grandmother; Breast cancer in her sister; Cancer in her sister; Coronary artery disease in her sister; Diabetes in her father, mother, paternal grandmother, sister, and sister; Early death in her brother, brother, and father; Healthy in her sister; Heart attack in her father, mother, sister, and sister; Heart disease in her father, mother, sister, sister, and sister; Hyperlipidemia in her sister, sister, and sister; Hypertension in her mother, sister, sister, and sister; Kidney disease in her sister and sister; Kidney failure in her sister; Obesity in her mother and sister; Stroke in her maternal grandfather; Vision loss in her paternal grandmother.  ROS:   Please see the history of present illness.    *** All other systems reviewed and are negative.  EKGs/Labs/Other Studies Reviewed:    The following studies were reviewed today: ***  EKG:  EKG is *** ordered today.  The ekg ordered today demonstrates ***  Recent Labs: 04/27/2024: ALT 27 05/01/2024: BUN 16; Creatinine, Ser 0.82; Hemoglobin 12.8; Platelets 228; Potassium 3.7; Sodium 137 05/18/2024: TSH 2.86  Recent Lipid Panel    Component Value Date/Time   CHOL 163 05/01/2024 0403   TRIG 133 05/01/2024 0403   HDL 47 05/01/2024 0403   CHOLHDL 3.5 05/01/2024 0403   VLDL 27 05/01/2024 0403   LDLCALC 89 05/01/2024 0403   LDLDIRECT 94.0 04/29/2023 1523    Physical Exam:    VS:  There were no vitals taken for this visit.    Wt Readings from Last 3 Encounters:  08/18/24 138 lb (62.6 kg)  05/18/24 137 lb 6.4 oz (62.3 kg)  05/11/24 137 lb 3.2 oz (62.2 kg)     GEN: *** Well  nourished, well developed in no acute distress HEENT: Normal NECK: No JVD; No carotid bruits LYMPHATICS: No lymphadenopathy CARDIAC: ***RRR, no murmurs, rubs, gallops RESPIRATORY:  Clear to auscultation without rales, wheezing or rhonchi  ABDOMEN: Soft, non-tender, non-distended MUSCULOSKELETAL:  No edema; No deformity  SKIN: Warm and dry NEUROLOGIC:  Alert and oriented x 3 PSYCHIATRIC:  Normal affect   ASSESSMENT:    No diagnosis found. PLAN:    MINOCA: admitted 04/2024 with chest pain and mild troponin elevation.  She reported chest pain persisting for hours, troponin 148 > 119.  EKG showed ST depressions.  Echo 04/30/2024 showed normal biventricular function, no significant valvular disease.  LHC showed normal coronary arteries.  There was thought to be possible myocarditis given her recent COVID infection but cardiac MRI was not done. - Recommend cardiac MRI  Hypertension: On indapamide  1.25 mg daily and amlodipine  5 mg daily and metoprolol  50 mg twice daily  Hyperlipidemia: On atorvastatin  40 mg daily   RTC in***  Medication Adjustments/Labs and Tests Ordered: Current medicines are reviewed at length with the patient today.  Concerns regarding medicines are outlined above.  No orders of the defined types were placed in this encounter.  No orders of the defined types were placed in this encounter.   There are no Patient Instructions on file for this visit.   Signed, Lonni LITTIE Nanas, MD  08/23/2024 3:02 PM    [facility]

## 2024-08-24 ENCOUNTER — Telehealth: Admitting: Family Medicine

## 2024-08-24 ENCOUNTER — Ambulatory Visit: Admitting: Cardiology

## 2024-08-24 ENCOUNTER — Encounter: Payer: Self-pay | Admitting: Family Medicine

## 2024-08-24 NOTE — Progress Notes (Signed)
 Unable to connect via video or call. Not answering- straight to voice mail.

## 2024-08-27 ENCOUNTER — Other Ambulatory Visit

## 2024-09-03 ENCOUNTER — Ambulatory Visit

## 2024-09-03 VITALS — BP 122/60 | HR 75 | Ht 65.0 in | Wt 138.8 lb

## 2024-09-03 DIAGNOSIS — Z0000 Encounter for general adult medical examination without abnormal findings: Secondary | ICD-10-CM

## 2024-09-03 NOTE — Progress Notes (Addendum)
 Subjective:   Angel Kim is a 83 y.o. female who presents for a Medicare Annual Wellness Visit.  Allergies (verified) Ciprofloxacin and Ofloxacin   History: Past Medical History:  Diagnosis Date   Allergy 1998   Drug allergy   Arthritis    Cataract 2019   Bilateral surgery 2019   Chronic kidney disease    CKD Stg 3   GERD (gastroesophageal reflux disease)    History of hiatal hernia    History of kidney stones    Hyperlipidemia 1990   On medication   Hypertension    Irregular heart rhythm    pt states has rapid rate at times, controlled with metoprolol    PONV (postoperative nausea and vomiting)    Past Surgical History:  Procedure Laterality Date   ABDOMINAL HYSTERECTOMY     APPENDECTOMY  2015   BREAST BIOPSY Right 2020   BREAST EXCISIONAL BIOPSY Left 2000   CYSTOSCOPY/URETEROSCOPY/HOLMIUM LASER/STENT PLACEMENT Bilateral 03/30/2022   Procedure: CYSTOSCOPY BILATERAL /URETEROSCOPY BILATERAL RETROGRADES CORRIN LASER/STENT PLACEMENT;  Surgeon: Watt Rush, MD;  Location: [facility];  Service: Urology;  Laterality: Bilateral;   LEFT HEART CATH AND CORONARY ANGIOGRAPHY N/A 04/30/2024   Procedure: LEFT HEART CATH AND CORONARY ANGIOGRAPHY;  Surgeon: Ladona Heinz, MD;  [facility];  Service: Cardiovascular;  Laterality: N/A;   REDUCTION MAMMAPLASTY  1970   Family History  Problem Relation Age of Onset   Diabetes Mother    Heart disease Mother    Heart attack Mother    Hypertension Mother    Obesity Mother    Diabetes Father    Heart disease Father    Heart attack Father    Early death Father    Heart attack Sister    Heart disease Sister    Coronary artery disease Sister    Kidney failure Sister    Breast cancer Sister        53s   Heart attack Sister    Healthy Sister    Early death Brother    Stroke Maternal Grandfather    Arthritis Paternal Grandmother    Diabetes Paternal Grandmother    Vision loss Paternal Grandmother     Cancer Sister    Diabetes Sister    Heart disease Sister    Hyperlipidemia Sister    Hypertension Sister    Kidney disease Sister    Obesity Sister    Diabetes Sister    Heart disease Sister    Hyperlipidemia Sister    Hypertension Sister    Early death Brother    Hyperlipidemia Sister    Hypertension Sister    Kidney disease Sister    Social History   Occupational History   Not on file  Tobacco Use   Smoking status: Former    Types: Cigarettes   Smokeless tobacco: Never  Substance and Sexual Activity   Alcohol use: Yes    Alcohol/week: 1.0 standard drink of alcohol    Types: 1 Glasses of wine per week    Comment: occasional   Drug use: Never   Sexual activity: Not Currently    Partners: Male   Tobacco Counseling Counseling given: Not Answered  SDOH Screenings   Food Insecurity: No Food Insecurity (09/03/2024)  Housing: Unknown (09/03/2024)  Transportation Needs: No Transportation Needs (09/03/2024)  Utilities: Not At Risk (09/03/2024)  Alcohol Screen: Low Risk  (08/11/2024)  Depression (PHQ2-9): Low Risk  (09/03/2024)  Financial Resource Strain: Low Risk  (08/11/2024)  Physical Activity: Insufficiently  Active (09/03/2024)  Social Connections: Moderately Integrated (09/03/2024)  Stress: No Stress Concern Present (09/03/2024)  Recent Concern: Stress - Stress Concern Present (08/11/2024)  Tobacco Use: Medium Risk (09/03/2024)  Health Literacy: Adequate Health Literacy (09/03/2024)   Depression Screen    09/03/2024   11:52 AM 08/18/2024   10:48 AM 09/02/2023   10:49 AM 11/27/2022   10:32 AM 08/27/2022    9:57 AM 03/07/2022    4:08 PM 12/28/2021    3:13 PM  PHQ 2/9 Scores  PHQ - 2 Score 0 0 0 0 0 0 4  PHQ- 9 Score  0    1        Data saved with a previous flowsheet row definition     Goals Addressed               This Visit's Progress     Patient Stated (pt-stated)        Patient stated she plans to continue walking and want to do more       Visit info /  Clinical Intake: Medicare Wellness Visit Type:: Subsequent Annual Wellness Visit Medicare Wellness Visit Mode:: In-person (required for WTM) Interpreter Needed?: No Pre-visit prep was completed: yes AWV questionnaire completed by patient prior to visit?: yes Date:: 08/31/24 Living arrangements:: (!) lives alone Patient's Overall Health Status Rating: very good Typical amount of pain: none Does pain affect daily life?: no Are you currently prescribed opioids?: no  Dietary Habits and Nutritional Risks How many meals a day?: 3 Eats fruit and vegetables daily?: yes Most meals are obtained by: preparing own meals In the last 2 weeks, have you had any of the following?: -- (none) Diabetic:: no  Functional Status Activities of Daily Living (to include ambulation/medication): Independent Ambulation: Independent with device- listed below Home Assistive Devices/Equipment: Eyeglasses; Dentures (specify type) Medication Administration: Independent Home Management: Independent Manage your own finances?: yes Primary transportation is: driving Concerns about vision?: no *vision screening is required for WTM* Concerns about hearing?: no  Fall Screening Falls in the past year?: 0 Number of falls in past year: 0 Was there an injury with Fall?: 0 Fall Risk Category Calculator: 0 Patient Fall Risk Level: Low Fall Risk  Fall Risk Patient at Risk for Falls Due to: No Fall Risks Fall risk Follow up: Falls evaluation completed; Falls prevention discussed  Home and Transportation Safety: All rugs have non-skid backing?: N/A, no rugs All stairs or steps have railings?: yes (outside) Grab bars in the bathtub or shower?: (!) no Have non-skid surface in bathtub or shower?: yes Good home lighting?: yes Regular seat belt use?: yes Hospital stays in the last year:: no  Cognitive Assessment Difficulty concentrating, remembering, or making decisions? : no Will 6CIT or Mini Cog be Completed:  yes What year is it?: 0 points What month is it?: 0 points Give patient an address phrase to remember (5 components): [address] [place] About what time is it?: 0 points Count backwards from 20 to 1: 0 points Say the months of the year in reverse: 0 points Repeat the address phrase from earlier: 0 points 6 CIT Score: 0 points  Advance Directives (For Healthcare) Does Patient Have a Medical Advance Directive?: Yes Does patient want to make changes to medical advance directive?: Yes (Inpatient - patient requests chaplain consult to change a medical advance directive) Type of Advance Directive: Living will; Healthcare Power of Attorney Copy of Healthcare Power of Attorney in Chart?: No - copy requested Copy of Living  Will in Chart?: No - copy requested  Reviewed/Updated  Reviewed/Updated: All        Objective:    Today's Vitals   09/03/24 1138  BP: 122/60  Pulse: 75  Weight: 138 lb 12.8 oz (63 kg)  Height: 5' 5 (1.651 m)   Body mass index is 23.1 kg/m.  Current Medications (verified) Outpatient Encounter Medications as of 09/03/2024  Medication Sig   acetaminophen  (TYLENOL ) 500 MG tablet Take 500-1,000 mg by mouth every 6 (six) hours as needed for moderate pain (pain score 4-6).   amLODipine  (NORVASC ) 5 MG tablet Take 1 tablet (5 mg total) by mouth daily.   atorvastatin  (LIPITOR) 40 MG tablet TAKE 1 TABLET BY MOUTH EVERY DAY   estradiol  (ESTRACE ) 0.5 MG tablet TAKE 1 TABLET BY MOUTH EVERY DAY   Fexofenadine  HCl (ALLEGRA  ALLERGY PO) Take 1 tablet by mouth daily.   indapamide  (LOZOL ) 1.25 MG tablet Take 1 tablet (1.25 mg total) by mouth daily.   metoprolol  tartrate (LOPRESSOR ) 50 MG tablet TAKE 1 TABLET BY MOUTH TWICE A DAY   Multiple Vitamin (MULTIVITAMIN ADULT PO) Take 1 capsule by mouth daily.   traZODone  (DESYREL ) 50 MG tablet Take 0.5 tablets (25 mg total) by mouth at bedtime as needed for sleep.   No facility-administered encounter medications on file as of  09/03/2024.   Hearing/Vision screen Hearing Screening - Comments:: Denies hearing difficulties   Vision Screening - Comments:: Denies vision concerns - wears reading eyeglasses (sees Dr Vannie at [facility] in [place], [place]) Immunizations and Health Maintenance Health Maintenance  Topic Date Due   DTaP/Tdap/Td (1 - Tdap) Never done   Zoster Vaccines- Shingrix (1 of 2) 12/04/2024 (Originally 03/15/1993)   COVID-19 Vaccine (9 - 2025-26 season) 09/23/2024   Medicare Annual Wellness (AWV)  09/03/2025   Pneumococcal Vaccine: 50+ Years  Completed   Influenza Vaccine  Completed   DEXA SCAN  Completed   Meningococcal B Vaccine  Aged Out        Assessment/Plan:  This is a routine wellness examination for [place].  Patient Care Team: Joshua Debby CROME, MD as PCP - General (Internal Medicine) Kate Lonni CROME, MD as PCP - Cardiology (Cardiology) Merceda Lela SAUNDERS, [facility] (Pharmacist)  I have personally reviewed and noted the following in the patient's chart:   Medical and social history Use of alcohol, tobacco or illicit drugs  Current medications and supplements including opioid prescriptions. Functional ability and status Nutritional status Physical activity Advanced directives List of other physicians Hospitalizations, surgeries, and ER visits in previous 12 months Vitals Screenings to include cognitive, depression, and falls Referrals and appointments  No orders of the defined types were placed in this encounter.  In addition, I have reviewed and discussed with patient certain preventive protocols, quality metrics, and best practice recommendations. A written personalized care plan for preventive services as well as general preventive health recommendations were provided to patient.   Verdie CHRISTELLA Saba, CMA   09/03/2024   Return in 1 year (on 09/03/2025).  After Visit Summary: (In Person-Declined) Patient declined AVS at this time.  Nurse Notes: scheduled 2026 AWV/CPE  appts

## 2024-09-03 NOTE — Patient Instructions (Addendum)
 Ms. Wolven,  Thank you for taking the time for your Medicare Wellness Visit. I appreciate your continued commitment to your health goals. Please review the care plan we discussed, and feel free to reach out if I can assist you further.  Please note that Annual Wellness Visits do not include a physical exam. Some assessments may be limited, especially if the visit was conducted virtually. If needed, we may recommend an in-person follow-up with your provider.  Ongoing Care Seeing your primary care provider every 3 to 6 months helps us  monitor your health and provide consistent, personalized care.   Referrals If a referral was made during today's visit and you haven't received any updates within two weeks, please contact the referred provider directly to check on the status.  Recommended Screenings:  Health Maintenance  Topic Date Due   DTaP/Tdap/Td vaccine (1 - Tdap) Never done   Zoster (Shingles) Vaccine (1 of 2) 12/04/2024*   COVID-19 Vaccine (9 - 2025-26 season) 09/23/2024   Medicare Annual Wellness Visit  09/03/2025   Pneumococcal Vaccine for age over 3  Completed   Flu Shot  Completed   DEXA scan (bone density measurement)  Completed   Meningitis B Vaccine  Aged Out  *Topic was postponed. The date shown is not the original due date.       04/30/2024    5:10 PM  Advanced Directives  Type of Diplomatic Services Operational Officer;Living will  Does patient want to make changes to medical advance directive? No - Patient declined    Vision: Annual vision screenings are recommended for early detection of glaucoma, cataracts, and diabetic retinopathy. These exams can also reveal signs of chronic conditions such as diabetes and high blood pressure.  Dental: Annual dental screenings help detect early signs of oral cancer, gum disease, and other conditions linked to overall health, including heart disease and diabetes.

## 2024-09-10 ENCOUNTER — Telehealth: Payer: Self-pay

## 2024-09-10 ENCOUNTER — Other Ambulatory Visit: Admitting: Pharmacist

## 2024-09-10 VITALS — BP 120/80

## 2024-09-10 DIAGNOSIS — I10 Essential (primary) hypertension: Secondary | ICD-10-CM

## 2024-09-10 NOTE — Telephone Encounter (Signed)
 Copied from CRM [id number]. Topic: General - Other >> Sep 10, 2024 10:27 AM Carlyon D wrote: Reason for CRM: Pt calling she has an appt with pharmacist miss Kristy at 10 am today. Pt called stating she has been waiting by hr phone and no one has called her I advised pt the appt was actually an in person visit and she stated no it should have been a phone call like she was told called CAL was told to send CRM back as miss Tully does her own scheduling. Please reach out to pt to get her rescheduled

## 2024-09-10 NOTE — Telephone Encounter (Signed)
 Telephone appt was completed  Darrelyn Drum, PharmD, BCPS, CPP Clinical Pharmacist Practitioner [facility] [phone]

## 2024-09-10 NOTE — Progress Notes (Signed)
 09/10/2024 Name: Angel Kim, Angel Kim  Chief Complaint  Patient presents with   Hypertension   Medication Management    Angel Kim is a 83 y.o. year old female who presented for a telephone visit.   They were referred to the pharmacist by their PCP for assistance in managing hypertension.  Subjective:  Care Team: Primary Care Provider: Joshua Debby CROME, MD ; Next Scheduled Visit: 08/18/2024 Cardiologist: Dr. Kate; Next Scheduled Visit: 08/24/2024  Medication Access/Adherence  Current Pharmacy:  [id number] - [place], [place] - [address] [place] [address] Phone: [phone] Fax: [phone]   Patient reports affordability concerns with their medications: No  Patient reports access/transportation concerns to their pharmacy: No  Patient reports adherence concerns with their medications:  No     Hypertension:  Current medications: Amlodipine  5 mg daily, Indapamide  1.25 mg 1 tablet daily, Metoprolol  50mg   BID  Medications previously tried: Olmesartan  (d/c during hospitalization due to increase in Scr after cardiac cath) - Indapamide  was decreased during hospitalization due to bump in Scr after cardiac cath   Patient has a validated, automated, upper arm home BP cuff Current blood pressure readings readings: ~120/80   Patient reports hypotensive s/sx including occasional dizziness, lightheadedness. Notes it has not been frequent and not significant. Patient reports hypertensive symptoms including headache, chest pain, shortness of breath   Objective:  Lab Results  Component Value Date   HGBA1C 5.3 05/01/2024    Lab Results  Component Value Date   CREATININE 0.82 05/01/2024   BUN 16 05/01/2024   NA 137 05/01/2024   K 3.7 05/01/2024   CL 107 05/01/2024   CO2 25 05/01/2024    Lab Results  Component Value Date   CHOL 163 05/01/2024   HDL 47 05/01/2024   LDLCALC 89 05/01/2024   LDLDIRECT  94.0 04/29/2023   TRIG 133 05/01/2024   CHOLHDL 3.5 05/01/2024    Medications Reviewed Today     Reviewed by Merceda Lela SAUNDERS, RPH (Pharmacist) on 09/10/24 at 1650  Med List Status: <None>   Medication Order Taking? Sig Documenting Provider Last Dose Status Informant  acetaminophen  (TYLENOL ) 500 MG tablet [id number]  Take 500-1,000 mg by mouth every 6 (six) hours as needed for moderate pain (pain score 4-6). [provider]  Active Self, Pharmacy Records  amLODipine  (NORVASC ) 5 MG tablet [id number] Yes Take 1 tablet (5 mg total) by mouth daily. Joshua Debby CROME, MD  Active   atorvastatin  (LIPITOR) 40 MG tablet [id number]  TAKE 1 TABLET BY MOUTH EVERY DAY Joshua Debby CROME, MD  Active Self, Pharmacy Records  estradiol  (ESTRACE ) 0.5 MG tablet [id number]  TAKE 1 TABLET BY MOUTH EVERY DAY Joshua Debby CROME, MD  Active   Fexofenadine  HCl (ALLEGRA  ALLERGY PO) [id number]  Take 1 tablet by mouth daily. [provider]  Active Self, Pharmacy Records  indapamide  (LOZOL ) 1.25 MG tablet [id number] Yes Take 1 tablet (1.25 mg total) by mouth daily. Joshua Debby CROME, MD  Active   metoprolol  tartrate (LOPRESSOR ) 50 MG tablet [id number] Yes TAKE 1 TABLET BY MOUTH TWICE A DAY Joshua Debby CROME, MD  Active   Multiple Vitamin (MULTIVITAMIN ADULT PO) [id number]  Take 1 capsule by mouth daily. [provider]  Active Self, Pharmacy Records  traZODone  (DESYREL ) 50 MG tablet [id number]  Take 0.5 tablets (25 mg total) by mouth at bedtime as needed for sleep. Joshua Debby CROME, MD  Active  Assessment/Plan:   Hypertension: - Currently controlled Goal BP < 130/80  - Recommend to continue monitoring blood pressure at home  - Continue current regimen. Needs BMP at next follow up   Follow Up Plan: January PCP f/u    Darrelyn Drum, PharmD, BCPS, CPP Clinical Pharmacist Practitioner [facility] [phone]

## 2024-09-21 NOTE — Progress Notes (Unsigned)
 Cardiology Office Note:    Date:  09/22/2024   ID:  Angel Kim, DOB July 31, 1941, MRN [id number]  PCP:  Joshua Debby CROME, MD  Cardiologist:  Lonni CROME Nanas, MD  Electrophysiologist:  None   Referring MD: Joshua Debby CROME, MD   Chief Complaint  Patient presents with   [facility]    History of Present Illness:    Angel Kim is a 83 y.o. female with a hx of hypertension, hyperlipidemia who presents for follow-up.  She was admitted 04/2024 with chest pain and mild troponin elevation.  She reported chest pain persisting for hours, troponin 148 > 119.  EKG showed ST depressions.  Echo 04/30/2024 showed normal biventricular function, no significant valvular disease.  LHC showed normal coronary arteries.  There was thought to be possible myocarditis given her recent COVID infection but cardiac MRI was not done.  Since last clinic visit, she reports has been having intermittent chest pain, described as aching pain.  Has not been pleuritic.  Occurs at rest.  She walks for exercise, has not noted any exertional chest pain or dyspnea.  She denies any lower extremity edema.  Does report some lightheadedness but no syncope.  Reports having palpitations nearly daily, lasts for short duration.    Past Medical History:  Diagnosis Date   Allergy 1998   Drug allergy   Arthritis    Cataract 2019   Bilateral surgery 2019   Chronic kidney disease    CKD Stg 3   GERD (gastroesophageal reflux disease)    History of hiatal hernia    History of kidney stones    Hyperlipidemia 1990   On medication   Hypertension    Irregular heart rhythm    pt states has rapid rate at times, controlled with metoprolol    PONV (postoperative nausea and vomiting)     Past Surgical History:  Procedure Laterality Date   ABDOMINAL HYSTERECTOMY     APPENDECTOMY  2015   BREAST BIOPSY Right 2020   BREAST EXCISIONAL BIOPSY Left 2000   CYSTOSCOPY/URETEROSCOPY/HOLMIUM LASER/STENT PLACEMENT Bilateral 03/30/2022    Procedure: CYSTOSCOPY BILATERAL /URETEROSCOPY BILATERAL RETROGRADES CORRIN LASER/STENT PLACEMENT;  Surgeon: Watt Rush, MD;  Location: [facility];  Service: Urology;  Laterality: Bilateral;   LEFT HEART CATH AND CORONARY ANGIOGRAPHY N/A 04/30/2024   Procedure: LEFT HEART CATH AND CORONARY ANGIOGRAPHY;  Surgeon: Ladona Heinz, MD;  Location: [facility];  Service: Cardiovascular;  Laterality: N/A;   REDUCTION MAMMAPLASTY  1970    Current Medications: Current Meds  Medication Sig   acetaminophen  (TYLENOL ) 500 MG tablet Take 500-1,000 mg by mouth every 6 (six) hours as needed for moderate pain (pain score 4-6).   amLODipine  (NORVASC ) 5 MG tablet Take 1 tablet (5 mg total) by mouth daily.   atorvastatin  (LIPITOR) 40 MG tablet TAKE 1 TABLET BY MOUTH EVERY DAY   estradiol  (ESTRACE ) 0.5 MG tablet TAKE 1 TABLET BY MOUTH EVERY DAY   Fexofenadine  HCl (ALLEGRA  ALLERGY PO) Take 1 tablet by mouth daily.   indapamide  (LOZOL ) 2.5 MG tablet Take 2.5 mg by mouth daily.   metoprolol  tartrate (LOPRESSOR ) 50 MG tablet TAKE 1 TABLET BY MOUTH TWICE A DAY   Multiple Vitamin (MULTIVITAMIN ADULT PO) Take 1 capsule by mouth daily.   traZODone  (DESYREL ) 50 MG tablet Take 0.5 tablets (25 mg total) by mouth at bedtime as needed for sleep.   [DISCONTINUED] indapamide  (LOZOL ) 1.25 MG tablet Take 1 tablet (1.25 mg total) by mouth daily.  Allergies:   Ciprofloxacin and Ofloxacin   Social History   Socioeconomic History   Marital status: Widowed    Spouse name: Not on file   Number of children: Not on file   Years of education: Not on file   Highest education level: Associate degree: occupational, scientist, product/process development, or vocational program  Occupational History   Not on file  Tobacco Use   Smoking status: Former    Types: Cigarettes   Smokeless tobacco: Never  Substance and Sexual Activity   Alcohol use: Yes    Alcohol/week: 1.0 standard drink of alcohol    Types: 1 Glasses of wine per week     Comment: occasional   Drug use: Never   Sexual activity: Not Currently    Partners: Male  Other Topics Concern   Not on file  Social History Narrative   Not on file   Social Drivers of Health   Financial Resource Strain: Low Risk  (08/11/2024)   Overall Financial Resource Strain (CARDIA)    Difficulty of Paying Living Expenses: Not very hard  Food Insecurity: No Food Insecurity (09/03/2024)   Hunger Vital Sign    Worried About Running Out of Food in the Last Year: Never true    Ran Out of Food in the Last Year: Never true  Transportation Needs: No Transportation Needs (09/03/2024)   PRAPARE - Administrator, Civil Service (Medical): No    Lack of Transportation (Non-Medical): No  Physical Activity: Insufficiently Active (09/03/2024)   Exercise Vital Sign    Days of Exercise per Week: 1 day    Minutes of Exercise per Session: 20 min  Stress: No Stress Concern Present (09/03/2024)   [employer] of Occupational Health - Occupational Stress Questionnaire    Feeling of Stress: Not at all  Recent Concern: Stress - Stress Concern Present (08/11/2024)   [employer] of Occupational Health - Occupational Stress Questionnaire    Feeling of Stress: To some extent  Social Connections: Moderately Integrated (09/03/2024)   Social Connection and Isolation Panel    Frequency of Communication with Friends and Family: More than three times a week    Frequency of Social Gatherings with Friends and Family: Twice a week    Attends Religious Services: More than 4 times per year    Active Member of [employer] or Organizations: Yes    Attends Banker Meetings: More than 4 times per year    Marital Status: Widowed     Family History: The patient's family history includes Arthritis in her paternal grandmother; Breast cancer in her sister; Cancer in her sister; Coronary artery disease in her sister; Diabetes in her father, mother, paternal grandmother, sister, and sister;  Early death in her brother, brother, and father; Healthy in her sister; Heart attack in her father, mother, sister, and sister; Heart disease in her father, mother, sister, sister, and sister; Hyperlipidemia in her sister, sister, and sister; Hypertension in her mother, sister, sister, and sister; Kidney disease in her sister and sister; Kidney failure in her sister; Obesity in her mother and sister; Stroke in her maternal grandfather; Vision loss in her paternal grandmother.  ROS:   Please see the history of present illness.     All other systems reviewed and are negative.  EKGs/Labs/Other Studies Reviewed:    The following studies were reviewed today:   EKG:  EKG is not ordered today.    Recent Labs: 04/27/2024: ALT 27 05/01/2024: BUN 16; Creatinine, Ser 0.82; Hemoglobin 12.8;  Platelets 228; Potassium 3.7; Sodium 137 05/18/2024: TSH 2.86  Recent Lipid Panel    Component Value Date/Time   CHOL 163 05/01/2024 0403   TRIG 133 05/01/2024 0403   HDL 47 05/01/2024 0403   CHOLHDL 3.5 05/01/2024 0403   VLDL 27 05/01/2024 0403   LDLCALC 89 05/01/2024 0403   LDLDIRECT 94.0 04/29/2023 1523    Physical Exam:    VS:  BP 128/68   Pulse 74   Ht 5' 5 (1.651 m)   Wt 136 lb 12.8 oz (62.1 kg)   SpO2 94%   BMI 22.76 kg/m     Wt Readings from Last 3 Encounters:  09/22/24 136 lb 12.8 oz (62.1 kg)  09/03/24 138 lb 12.8 oz (63 kg)  08/18/24 138 lb (62.6 kg)     GEN:  Well nourished, well developed in no acute distress HEENT: Normal NECK: No JVD; No carotid bruits LYMPHATICS: No lymphadenopathy CARDIAC: RRR, no murmurs, rubs, gallops RESPIRATORY:  Clear to auscultation without rales, wheezing or rhonchi  ABDOMEN: Soft, non-tender, non-distended MUSCULOSKELETAL:  No edema; No deformity  SKIN: Warm and dry NEUROLOGIC:  Alert and oriented x 3 PSYCHIATRIC:  Normal affect   ASSESSMENT:    1. Myocarditis, unspecified chronicity, unspecified myocarditis type (HCC)   2. Primary hypertension    3. Hyperlipidemia LDL goal <100   4. Palpitations    PLAN:    MINOCA/suspected myocarditis: admitted 04/2024 with chest pain and mild troponin elevation.  She reported chest pain persisting for hours, troponin 148 > 119.  EKG showed ST depressions.  Echo 04/30/2024 showed normal biventricular function, no significant valvular disease.  LHC showed normal coronary arteries.  There was thought to be possible myocarditis given her recent COVID infection but cardiac MRI was not done. - Recommend cardiac MRI - Check ESR, CRP  Palpitations: Description concerning for arrhythmia, will evaluate with Zio patch x 7 days  Hypertension: On indapamide  1.25 mg daily and amlodipine  5 mg daily and metoprolol  50 mg twice daily.  Check BMET, magnesium  Hyperlipidemia: On atorvastatin  40 mg daily   RTC in 6 months  Medication Adjustments/Labs and Tests Ordered: Current medicines are reviewed at length with the patient today.  Concerns regarding medicines are outlined above.  Orders Placed This Encounter  Procedures   MR CARDIAC MORPHOLOGY W WO CONTRAST   Hemoglobin and hematocrit, blood   Basic metabolic panel with GFR   Magnesium   Sedimentation rate   C-reactive protein   LONG TERM MONITOR (3-14 DAYS)   No orders of the defined types were placed in this encounter.   Patient Instructions  Medication Instructions:  NO Changes *If you need a refill on your cardiac medications before your next appointment, please call your pharmacy*  Lab Work: Today: Hemoglobin/Hematrocit, BMET, Magnesium, ESR, CRP  If you have labs (blood work) drawn today and your tests are completely normal, you will receive your results only by: MyChart Message (if you have MyChart) OR A paper copy in the mail If you have any lab test that is abnormal or we need to change your treatment, we will call you to review the results.  Testing/Procedures:   Cardiac MRI at one of the below locations:?  [facility] [address] [place], [place] [address] Please take advantage of the free valet parking available at the [facility] and [employer] (Entrance C).  Proceed to the [facility] Radiology Department (First Floor) for check-in.   OR   [facility]  [address] [place], [place] [address] Please go to the [facility] and check-in with the desk attendant.   Magnetic resonance imaging (MRI) is a painless test that produces images of the inside of the body without using Xrays.  During an MRI, strong magnets and radio waves work together in a data processing manager to form detailed images.   MRI images may provide more details about a medical condition than X-rays, CT scans, and ultrasounds can provide.  You may be given earphones to listen for instructions.  You may eat a light breakfast and take medications as ordered with the exception of furosemide, hydrochlorothiazide, chlorthalidone or spironolactone (or any other fluid pill). If you are undergoing a stress MRI, please avoid stimulants for 12 hr prior to test. (I.e. Caffeine, nicotine, chocolate, or antihistamine medications)  If your provider has ordered anti-anxiety medications for this test, then you will need a driver.  An IV will be inserted into one of your veins. Contrast material will be injected into your IV. It will leave your body through your urine within a day. You may be told to drink plenty of fluids to help flush the contrast material out of your system.  You will be asked to remove all metal, including: Watch, jewelry, and other metal objects including hearing aids, hair pieces and dentures. Also wearable glucose monitoring systems (ie. Freestyle Libre and Omnipods) (Braces and fillings normally are not a problem.)   TEST WILL TAKE APPROXIMATELY 1 HOUR  PLEASE NOTIFY SCHEDULING AT LEAST 24 HOURS IN ADVANCE IF YOU ARE UNABLE TO KEEP YOUR APPOINTMENT. [phone]  For more  information and frequently asked questions, please visit our website : [email or web address]  Please call the Cardiac Imaging Nurse Navigators with any questions/concerns. [phone] Office    ZIO XT- Long Term Monitor Instructions  Your physician has requested you wear a ZIO patch monitor for 7 days.  This is a single patch monitor. [employer] supplies one patch monitor per enrollment. Additional stickers are not available. Please do not apply patch if you will be having a Nuclear Stress Test,  Echocardiogram, Cardiac CT, MRI, or Chest Xray during the period you would be wearing the  monitor. The patch cannot be worn during these tests. You cannot remove and re-apply the  ZIO XT patch monitor.  Your ZIO patch monitor will be mailed 3 day USPS to your address on file. It may take 3-5 days  to receive your monitor after you have been enrolled.  Once you have received your monitor, please review the enclosed instructions. Your monitor  has already been registered assigning a specific monitor serial # to you.  Billing and Patient Assistance Program Information  We have supplied [employer] with any of your insurance information on file for billing purposes. [employer] offers a sliding scale Patient Assistance Program for patients that do not have  insurance, or whose insurance does not completely cover the cost of the ZIO monitor.  You must apply for the Patient Assistance Program to qualify for this discounted rate.  To apply, please call [employer] at [phone], select option 4, select option 2, ask to apply for  Patient Assistance Program. Meredeth will ask your household income, and how many people  are in your household. They will quote your out-of-pocket cost based on that information.  [employer] will also be able to set up a 61-month, interest-free payment plan if needed.  Applying the monitor   Shave hair from upper left chest.  Hold abrader disc by  orange tab. Rub abrader in 40  strokes over the upper left chest as  indicated in your monitor instructions.  Clean area with 4 enclosed alcohol pads. Let dry.  Apply patch as indicated in monitor instructions. Patch will be placed under collarbone on left  side of chest with arrow pointing upward.  Rub patch adhesive wings for 2 minutes. Remove white label marked 1. Remove the white  label marked 2. Rub patch adhesive wings for 2 additional minutes.  While looking in a mirror, press and release button in center of patch. A small green light will  flash 3-4 times. This will be your only indicator that the monitor has been turned on.  Do not shower for the first 24 hours. You may shower after the first 24 hours.  Press the button if you feel a symptom. You will hear a small click. Record Date, Time and  Symptom in the Patient Logbook.  When you are ready to remove the patch, follow instructions on the last 2 pages of Patient  Logbook. Stick patch monitor onto the last page of Patient Logbook.  Place Patient Logbook in the blue and white box. Use locking tab on box and tape box closed  securely. The blue and white box has prepaid postage on it. Please place it in the mailbox as  soon as possible. Your physician should have your test results approximately 7 days after the  monitor has been mailed back to [facility]  Call [facility] Customer Care at [phone] if you have questions regarding  your ZIO XT patch monitor. Call them immediately if you see an orange light blinking on your  monitor.  If your monitor falls off in less than 4 days, contact our Monitor department at [phone].  If your monitor becomes loose or falls off after 4 days call [employer] at [phone] for  suggestions on securing your monitor   Follow-Up: At [facility], you and your health needs are our priority.  As part of our continuing mission to provide you with exceptional heart care, our providers are all part of one  team.  This team includes your primary Cardiologist (physician) and Advanced Practice Providers or APPs (Physician Assistants and Nurse Practitioners) who all work together to provide you with the care you need, when you need it.  Your next appointment:   6 month(s)  Provider:   Lonni LITTIE Nanas, MD              Signed, Lonni LITTIE Nanas, MD  09/22/2024 5:08 PM    [facility]

## 2024-09-22 ENCOUNTER — Ambulatory Visit

## 2024-09-22 ENCOUNTER — Encounter: Payer: Self-pay | Admitting: Cardiology

## 2024-09-22 ENCOUNTER — Ambulatory Visit: Attending: Cardiology | Admitting: Cardiology

## 2024-09-22 VITALS — BP 128/68 | HR 74 | Ht 65.0 in | Wt 136.8 lb

## 2024-09-22 DIAGNOSIS — I514 Myocarditis, unspecified: Secondary | ICD-10-CM | POA: Insufficient documentation

## 2024-09-22 DIAGNOSIS — R002 Palpitations: Secondary | ICD-10-CM | POA: Insufficient documentation

## 2024-09-22 DIAGNOSIS — E785 Hyperlipidemia, unspecified: Secondary | ICD-10-CM | POA: Diagnosis present

## 2024-09-22 DIAGNOSIS — I10 Essential (primary) hypertension: Secondary | ICD-10-CM | POA: Insufficient documentation

## 2024-09-22 NOTE — Patient Instructions (Signed)
 Medication Instructions:  NO Changes *If you need a refill on your cardiac medications before your next appointment, please call your pharmacy*  Lab Work: Today: Hemoglobin/Hematrocit, BMET, Magnesium, ESR, CRP  If you have labs (blood work) drawn today and your tests are completely normal, you will receive your results only by: MyChart Message (if you have MyChart) OR A paper copy in the mail If you have any lab test that is abnormal or we need to change your treatment, we will call you to review the results.  Testing/Procedures:   Cardiac MRI at one of the below locations:?  [facility] [address] [place], [place] [address] Please take advantage of the free valet parking available at the [facility] and [employer] (Entrance C).  Proceed to the [facility] Radiology Department (First Floor) for check-in.   OR   [facility] [address] [place], [place] [address] Please go to the [facility] and check-in with the desk attendant.   Magnetic resonance imaging (MRI) is a painless test that produces images of the inside of the body without using Xrays.  During an MRI, strong magnets and radio waves work together in a data processing manager to form detailed images.   MRI images may provide more details about a medical condition than X-rays, CT scans, and ultrasounds can provide.  You may be given earphones to listen for instructions.  You may eat a light breakfast and take medications as ordered with the exception of furosemide, hydrochlorothiazide, chlorthalidone or spironolactone (or any other fluid pill). If you are undergoing a stress MRI, please avoid stimulants for 12 hr prior to test. (I.e. Caffeine, nicotine, chocolate, or antihistamine medications)  If your provider has ordered anti-anxiety medications for this test, then you will need a driver.  An IV will be inserted into one of your veins. Contrast material will be injected  into your IV. It will leave your body through your urine within a day. You may be told to drink plenty of fluids to help flush the contrast material out of your system.  You will be asked to remove all metal, including: Watch, jewelry, and other metal objects including hearing aids, hair pieces and dentures. Also wearable glucose monitoring systems (ie. Freestyle Libre and Omnipods) (Braces and fillings normally are not a problem.)   TEST WILL TAKE APPROXIMATELY 1 HOUR  PLEASE NOTIFY SCHEDULING AT LEAST 24 HOURS IN ADVANCE IF YOU ARE UNABLE TO KEEP YOUR APPOINTMENT. [phone]  For more information and frequently asked questions, please visit our website : [email or web address]  Please call the Cardiac Imaging Nurse Navigators with any questions/concerns. [phone] Office    ZIO XT- Long Term Monitor Instructions  Your physician has requested you wear a ZIO patch monitor for 7 days.  This is a single patch monitor. [employer] supplies one patch monitor per enrollment. Additional stickers are not available. Please do not apply patch if you will be having a Nuclear Stress Test,  Echocardiogram, Cardiac CT, MRI, or Chest Xray during the period you would be wearing the  monitor. The patch cannot be worn during these tests. You cannot remove and re-apply the  ZIO XT patch monitor.  Your ZIO patch monitor will be mailed 3 day USPS to your address on file. It may take 3-5 days  to receive your monitor after you have been enrolled.  Once you have received your monitor, please review the enclosed instructions. Your monitor  has already been registered assigning a specific monitor  serial # to you.  Billing and Patient Assistance Program Information  We have supplied [employer] with any of your insurance information on file for billing purposes. [employer] offers a sliding scale Patient Assistance Program for patients that do not have  insurance, or whose insurance does not completely  cover the cost of the ZIO monitor.  You must apply for the Patient Assistance Program to qualify for this discounted rate.  To apply, please call [employer] at [phone], select option 4, select option 2, ask to apply for  Patient Assistance Program. Meredeth will ask your household income, and how many people  are in your household. They will quote your out-of-pocket cost based on that information.  [employer] will also be able to set up a 69-month, interest-free payment plan if needed.  Applying the monitor   Shave hair from upper left chest.  Hold abrader disc by orange tab. Rub abrader in 40 strokes over the upper left chest as  indicated in your monitor instructions.  Clean area with 4 enclosed alcohol pads. Let dry.  Apply patch as indicated in monitor instructions. Patch will be placed under collarbone on left  side of chest with arrow pointing upward.  Rub patch adhesive wings for 2 minutes. Remove white label marked 1. Remove the white  label marked 2. Rub patch adhesive wings for 2 additional minutes.  While looking in a mirror, press and release button in center of patch. A small green light will  flash 3-4 times. This will be your only indicator that the monitor has been turned on.  Do not shower for the first 24 hours. You may shower after the first 24 hours.  Press the button if you feel a symptom. You will hear a small click. Record Date, Time and  Symptom in the Patient Logbook.  When you are ready to remove the patch, follow instructions on the last 2 pages of Patient  Logbook. Stick patch monitor onto the last page of Patient Logbook.  Place Patient Logbook in the blue and white box. Use locking tab on box and tape box closed  securely. The blue and white box has prepaid postage on it. Please place it in the mailbox as  soon as possible. Your physician should have your test results approximately 7 days after the  monitor has been mailed back to [facility].  Call [facility] Customer Care at [phone] if you have questions regarding  your ZIO XT patch monitor. Call them immediately if you see an orange light blinking on your  monitor.  If your monitor falls off in less than 4 days, contact our Monitor department at [phone].  If your monitor becomes loose or falls off after 4 days call [employer] at [phone] for  suggestions on securing your monitor   Follow-Up: At [facility], you and your health needs are our priority.  As part of our continuing mission to provide you with exceptional heart care, our providers are all part of one team.  This team includes your primary Cardiologist (physician) and Advanced Practice Providers or APPs (Physician Assistants and Nurse Practitioners) who all work together to provide you with the care you need, when you need it.  Your next appointment:   6 month(s)  Provider:   Lonni LITTIE Nanas, MD

## 2024-09-22 NOTE — Progress Notes (Unsigned)
 Enrolled for [employer] to mail a ZIO XT long term holter monitor to the patients address on file.

## 2024-09-23 ENCOUNTER — Ambulatory Visit: Payer: Self-pay | Admitting: Cardiology

## 2024-09-23 DIAGNOSIS — I10 Essential (primary) hypertension: Secondary | ICD-10-CM

## 2024-09-23 LAB — BASIC METABOLIC PANEL WITH GFR
BUN/Creatinine Ratio: 20 (ref 12–28)
BUN: 17 mg/dL (ref 8–27)
CO2: 24 mmol/L (ref 20–29)
Calcium: 10.3 mg/dL (ref 8.7–10.3)
Chloride: 97 mmol/L (ref 96–106)
Creatinine, Ser: 0.87 mg/dL (ref 0.57–1.00)
Glucose: 94 mg/dL (ref 70–99)
Potassium: 3.4 mmol/L — ABNORMAL LOW (ref 3.5–5.2)
Sodium: 141 mmol/L (ref 134–144)
eGFR: 67 mL/min/1.73 (ref >59)

## 2024-09-23 LAB — SEDIMENTATION RATE: Sed Rate: 9 mm/h (ref 0–40)

## 2024-09-23 LAB — HEMOGLOBIN AND HEMATOCRIT, BLOOD
Hematocrit: 45.1 % (ref 34.0–46.6)
Hemoglobin: 14.9 g/dL (ref 11.1–15.9)

## 2024-09-23 LAB — C-REACTIVE PROTEIN: CRP: 1 mg/L (ref 0–10)

## 2024-09-23 LAB — MAGNESIUM: Magnesium: 1.9 mg/dL (ref 1.6–2.3)

## 2024-10-05 MED ORDER — POTASSIUM CHLORIDE CRYS ER 20 MEQ PO TBCR: 20.0000 meq | EXTENDED_RELEASE_TABLET | Freq: Every day | ORAL | 3 refills | Status: DC

## 2024-10-06 ENCOUNTER — Encounter (HOSPITAL_COMMUNITY): Payer: Self-pay

## 2024-10-07 ENCOUNTER — Ambulatory Visit (HOSPITAL_COMMUNITY)
Admission: RE | Admit: 2024-10-07 | Discharge: 2024-10-07 | Disposition: A | Source: Ambulatory Visit | Attending: Cardiology

## 2024-10-07 ENCOUNTER — Other Ambulatory Visit: Payer: Self-pay | Admitting: Cardiology

## 2024-10-07 DIAGNOSIS — I514 Myocarditis, unspecified: Secondary | ICD-10-CM

## 2024-10-07 DIAGNOSIS — R002 Palpitations: Secondary | ICD-10-CM

## 2024-10-07 MED ORDER — GADOBUTROL 1 MMOL/ML IV SOLN
10.0000 mL | Freq: Once | INTRAVENOUS | Status: AC | PRN
  Administered 2024-10-07: 10 mL via INTRAVENOUS

## 2024-10-10 ENCOUNTER — Other Ambulatory Visit: Payer: Self-pay | Admitting: Internal Medicine

## 2024-10-10 DIAGNOSIS — E785 Hyperlipidemia, unspecified: Secondary | ICD-10-CM

## 2024-10-17 ENCOUNTER — Other Ambulatory Visit: Payer: Self-pay | Admitting: Internal Medicine

## 2024-10-17 DIAGNOSIS — I10 Essential (primary) hypertension: Secondary | ICD-10-CM

## 2024-10-17 DIAGNOSIS — R000 Tachycardia, unspecified: Secondary | ICD-10-CM

## 2024-10-25 DIAGNOSIS — R002 Palpitations: Secondary | ICD-10-CM | POA: Diagnosis not present

## 2024-11-11 ENCOUNTER — Ambulatory Visit: Admitting: Cardiology

## 2024-11-13 ENCOUNTER — Other Ambulatory Visit: Payer: Self-pay | Admitting: Internal Medicine

## 2024-11-13 DIAGNOSIS — I10 Essential (primary) hypertension: Secondary | ICD-10-CM

## 2024-11-18 ENCOUNTER — Encounter: Payer: Self-pay | Admitting: Internal Medicine

## 2024-11-18 ENCOUNTER — Ambulatory Visit: Admitting: Internal Medicine

## 2024-11-18 ENCOUNTER — Ambulatory Visit: Payer: Self-pay | Admitting: Internal Medicine

## 2024-11-18 VITALS — BP 160/68 | HR 64 | Temp 97.5°F | Resp 16 | Ht 65.0 in | Wt 141.2 lb

## 2024-11-18 DIAGNOSIS — I10 Essential (primary) hypertension: Secondary | ICD-10-CM

## 2024-11-18 DIAGNOSIS — E876 Hypokalemia: Secondary | ICD-10-CM | POA: Insufficient documentation

## 2024-11-18 DIAGNOSIS — F5104 Psychophysiologic insomnia: Secondary | ICD-10-CM

## 2024-11-18 LAB — MAGNESIUM: Magnesium: 1.9 mg/dL (ref 1.5–2.5)

## 2024-11-18 LAB — BASIC METABOLIC PANEL WITH GFR
BUN: 18 mg/dL (ref 6–23)
CO2: 30 meq/L (ref 19–32)
Calcium: 10 mg/dL (ref 8.4–10.5)
Chloride: 102 meq/L (ref 96–112)
Creatinine, Ser: 0.76 mg/dL (ref 0.40–1.20)
GFR: 73.35 mL/min
Glucose, Bld: 99 mg/dL (ref 70–99)
Potassium: 3.9 meq/L (ref 3.5–5.1)
Sodium: 139 meq/L (ref 135–145)

## 2024-11-18 LAB — TSH: TSH: 1.79 u[IU]/mL (ref 0.35–5.50)

## 2024-11-18 MED ORDER — TRAZODONE HCL 50 MG PO TABS: 25.0000 mg | ORAL_TABLET | Freq: Every evening | ORAL | 1 refills | Status: AC | PRN

## 2024-11-18 MED ORDER — SPIRONOLACTONE 25 MG PO TABS: 25.0000 mg | ORAL_TABLET | Freq: Every day | ORAL | 0 refills | Status: AC

## 2024-11-18 NOTE — Progress Notes (Unsigned)
 "  Subjective:  Patient ID: Angel Kim, female    DOB: 11/16/1940  Age: 84 y.o. MRN: [id number]  CC: Hypertension (3 month follow up )   HPI Angel Kim presents for f/up   Discussed the use of AI scribe software for clinical note transcription with the patient, who gave verbal consent to proceed.  History of Present Illness Angel Kim is an 84 year old female with hypertension who presents with lightheadedness and palpitations.  She has a history of low potassium and is on potassium supplementation, but her levels have not been retested. She experiences occasional muscle cramps but no muscle twitches.  She describes persistent lightheadedness that affects her stability and limits her activity on bad days. She experienced a severe episode of palpitations two to three months ago while driving, accompanied by chest pain, leading to hospitalization and a complete cardiac workup, including an MRI of the heart. She recently wore a heart monitor for a week, and the results are available in her chart.  She experiences headaches, sometimes with visual disturbances described as 'little jagged lines,' and treats severe headaches with extra strength Tylenol , which provides relief. She associates these headaches with aging.  Her sleep is disrupted despite taking trazodone , as she often wakes between 3 and 4 AM. She does not use melatonin or Benadryl for sleep.  She takes indapamide  for hypertension and notes occasional swelling that does not fully resolve overnight. She was previously on Lugol but was taken off due to low blood pressure during a heart issue.     Outpatient Medications Prior to Visit  Medication Sig Dispense Refill   acetaminophen  (TYLENOL ) 500 MG tablet Take 500-1,000 mg by mouth every 6 (six) hours as needed for moderate pain (pain score 4-6).     amLODipine  (NORVASC ) 5 MG tablet Take 5 mg by mouth daily.     atorvastatin  (LIPITOR) 40 MG tablet TAKE 1 TABLET BY MOUTH  EVERY DAY 90 tablet 1   estradiol  (ESTRACE ) 0.5 MG tablet TAKE 1 TABLET BY MOUTH EVERY DAY 90 tablet 1   Fexofenadine  HCl (ALLEGRA  ALLERGY PO) Take 1 tablet by mouth daily.     metoprolol  tartrate (LOPRESSOR ) 50 MG tablet TAKE 1 TABLET BY MOUTH TWICE A DAY 180 tablet 0   Multiple Vitamin (MULTIVITAMIN ADULT PO) Take 1 capsule by mouth daily.     potassium chloride  SA (KLOR-CON  M20) 20 MEQ tablet Take 1 tablet (20 mEq total) by mouth daily. 90 tablet 3   amLODipine  (NORVASC ) 2.5 MG tablet Take 2.5 mg by mouth daily.     indapamide  (LOZOL ) 1.25 MG tablet Take 1.25 mg by mouth daily.     indapamide  (LOZOL ) 2.5 MG tablet Take 2.5 mg by mouth daily.     traZODone  (DESYREL ) 50 MG tablet Take 0.5 tablets (25 mg total) by mouth at bedtime as needed for sleep. 45 tablet 1   amLODipine  (NORVASC ) 5 MG tablet TAKE 1 TABLET (5 MG TOTAL) BY MOUTH DAILY. 90 tablet 0   No facility-administered medications prior to visit.    ROS Review of Systems  Objective:  BP (!) 160/68 (BP Location: Left Arm, Patient Position: Sitting, Cuff Size: Normal)   Pulse 64   Temp (!) 97.5 F (36.4 C) (Oral)   Resp 16   Ht 5' 5 (1.651 m)   Wt 141 lb 3.2 oz (64 kg)   SpO2 96%   BMI 23.50 kg/m   BP Readings from Last 3 Encounters:  11/18/24 (!) 160/68  09/22/24 128/68  09/10/24 120/80    Wt Readings from Last 3 Encounters:  11/18/24 141 lb 3.2 oz (64 kg)  09/22/24 136 lb 12.8 oz (62.1 kg)  09/03/24 138 lb 12.8 oz (63 kg)    Physical Exam Vitals reviewed.  Constitutional:      Appearance: Normal appearance.  HENT:     Nose: Nose normal.     Mouth/Throat:     Mouth: Mucous membranes are moist.  Eyes:     General: No scleral icterus.    Conjunctiva/sclera: Conjunctivae normal.  Cardiovascular:     Rate and Rhythm: Normal rate and regular rhythm.     Heart sounds: No murmur heard.    No friction rub. No gallop.  Pulmonary:     Effort: Pulmonary effort is normal.     Breath sounds: No stridor. No  wheezing, rhonchi or rales.  Abdominal:     General: Abdomen is flat.     Palpations: There is no mass.     Tenderness: There is no abdominal tenderness. There is no guarding.     Hernia: No hernia is present.  Musculoskeletal:        General: Normal range of motion.     Cervical back: Neck supple.     Right lower leg: No edema.     Left lower leg: No edema.  Lymphadenopathy:     Cervical: No cervical adenopathy.  Skin:    General: Skin is warm and dry.  Neurological:     General: No focal deficit present.     Mental Status: She is alert.  Psychiatric:        Mood and Affect: Mood normal.     Lab Results  Component Value Date   WBC 7.7 05/01/2024   HGB 14.9 09/22/2024   HCT 45.1 09/22/2024   PLT 228 05/01/2024   GLUCOSE 94 09/22/2024   CHOL 163 05/01/2024   TRIG 133 05/01/2024   HDL 47 05/01/2024   LDLDIRECT 94.0 04/29/2023   LDLCALC 89 05/01/2024   ALT 27 04/27/2024   AST 28 04/27/2024   NA 141 09/22/2024   K 3.4 (L) 09/22/2024   CL 97 09/22/2024   CREATININE 0.87 09/22/2024   BUN 17 09/22/2024   CO2 24 09/22/2024   TSH 2.86 05/18/2024   HGBA1C 5.3 05/01/2024    MR CARDIAC VELOCITY FLOW MAP Result Date: 10/08/2024 CLINICAL DATA:  Possible myocarditis EXAM: CARDIAC MRI TECHNIQUE: The patient was scanned on a 1.5 Tesla GE magnet. A dedicated cardiac coil was used. Functional imaging was done using Fiesta sequences. 2,3, and 4 chamber views were done to assess for RWMA's. Modified Simpson's rule using a short axis stack was used to calculate an ejection fraction on a dedicated work [employer]. The patient received 8 cc of Gadavist . After 10 minutes inversion recovery sequences were used to assess for infiltration and scar tissue. FINDINGS: Limited images of the lung fields and upper abdomen showed scattered tiny lung nodules, similar to 7/25 CTA chest. Multiple small liver cysts also noted on prior CT. Normal left ventricular size and wall thickness.  Mild diffuse hypokinesis, LV EF 52%. Normal right ventricular size and systolic function, RV EF 53%. Normal left and right atrial sizes. Trileaflet aortic valve with no significant stenosis or regurgitation. No significant mitral regurgitation. On delayed enhancement imaging, there was no myocardial late gadolinium enhancement. MEASUREMENTS: MEASUREMENTS LVEDV 127 mL LVEDVi 75 mL/m2 LVSV 66 mL LVEF 52% RVEDV 139 mL RVEDVi 84 mL/m2 RVSV  73 mL RVEF 53% Aortic forward volume 74 mL Aortic regurgitant fraction 0.8% Global T1 999, ECV 24% Global T2 48 (normal range) IMPRESSION: 1.  Normal LV size with mild diffuse hypokinesis, LV EF 52%. 2.  Normal RV size and systolic function, RV EF 53%. 3. No myocardial LGE, so no evidence for prior MI, infiltrative disease or myocarditis. 4. Normal T1 and T2 parameters, normal extracellular volume percentage. 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 Dalton Mclean Electronically Signed   By: Ezra Shuck M.D.   On: 10/08/2024 16:27   MR CARDIAC VELOCITY FLOW MAP Result Date: 10/08/2024 CLINICAL DATA:  Possible myocarditis EXAM: CARDIAC MRI TECHNIQUE: The patient was scanned on a 1.5 Tesla GE magnet. A dedicated cardiac coil was used. Functional imaging was done using Fiesta sequences. 2,3, and 4 chamber views were done to assess for RWMA's. Modified Simpson's rule using a short axis stack was used to calculate an ejection fraction on a dedicated work [employer]. The patient received 8 cc of Gadavist . After 10 minutes inversion recovery sequences were used to assess for infiltration and scar tissue. FINDINGS: Limited images of the lung fields and upper abdomen showed scattered tiny lung nodules, similar to 7/25 CTA chest. Multiple small liver cysts also noted on prior CT. Normal left ventricular size and wall thickness. Mild diffuse hypokinesis, LV EF 52%. Normal right ventricular size and systolic function, RV EF 53%. Normal left and  right atrial sizes. Trileaflet aortic valve with no significant stenosis or regurgitation. No significant mitral regurgitation. On delayed enhancement imaging, there was no myocardial late gadolinium enhancement. MEASUREMENTS: MEASUREMENTS LVEDV 127 mL LVEDVi 75 mL/m2 LVSV 66 mL LVEF 52% RVEDV 139 mL RVEDVi 84 mL/m2 RVSV 73 mL RVEF 53% Aortic forward volume 74 mL Aortic regurgitant fraction 0.8% Global T1 999, ECV 24% Global T2 48 (normal range) IMPRESSION: 1.  Normal LV size with mild diffuse hypokinesis, LV EF 52%. 2.  Normal RV size and systolic function, RV EF 53%. 3. No myocardial LGE, so no evidence for prior MI, infiltrative disease or myocarditis. 4. Normal T1 and T2 parameters, normal extracellular volume percentage. 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 Dalton Mclean Electronically Signed   By: Ezra Shuck M.D.   On: 10/08/2024 16:27   MR CARDIAC MORPHOLOGY W WO CONTRAST Result Date: 10/08/2024 CLINICAL DATA:  Possible myocarditis EXAM: CARDIAC MRI TECHNIQUE: The patient was scanned on a 1.5 Tesla GE magnet. A dedicated cardiac coil was used. Functional imaging was done using Fiesta sequences. 2,3, and 4 chamber views were done to assess for RWMA's. Modified Simpson's rule using a short axis stack was used to calculate an ejection fraction on a dedicated work [employer]. The patient received 8 cc of Gadavist . After 10 minutes inversion recovery sequences were used to assess for infiltration and scar tissue. FINDINGS: Limited images of the lung fields and upper abdomen showed scattered tiny lung nodules, similar to 7/25 CTA chest. Multiple small liver cysts also noted on prior CT. Normal left ventricular size and wall thickness. Mild diffuse hypokinesis, LV EF 52%. Normal right ventricular size and systolic function, RV EF 53%. Normal left and right atrial sizes. Trileaflet aortic valve with no significant stenosis or regurgitation. No significant mitral  regurgitation. On delayed enhancement imaging, there was no myocardial late gadolinium enhancement. MEASUREMENTS: MEASUREMENTS LVEDV 127 mL LVEDVi 75 mL/m2 LVSV 66 mL LVEF 52% RVEDV 139 mL RVEDVi 84 mL/m2 RVSV 73 mL RVEF 53% Aortic forward volume  74 mL Aortic regurgitant fraction 0.8% Global T1 999, ECV 24% Global T2 48 (normal range) IMPRESSION: 1.  Normal LV size with mild diffuse hypokinesis, LV EF 52%. 2.  Normal RV size and systolic function, RV EF 53%. 3. No myocardial LGE, so no evidence for prior MI, infiltrative disease or myocarditis. 4. Normal T1 and T2 parameters, normal extracellular volume percentage. 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 Dalton Mclean Electronically Signed   By: Ezra Shuck M.D.   On: 10/08/2024 16:27    Assessment & Plan:  Primary hypertension -     Basic metabolic panel with GFR; Future -     Magnesium; Future -     TSH; Future -     AMB Referral VBCI Care Management -     Spironolactone ; Take 1 tablet (25 mg total) by mouth daily.  Dispense: 90 tablet; Refill: 0  Low blood potassium -     Basic metabolic panel with GFR; Future -     Magnesium; Future -     AMB Referral VBCI Care Management -     Spironolactone ; Take 1 tablet (25 mg total) by mouth daily.  Dispense: 90 tablet; Refill: 0  Psychophysiological insomnia -     traZODone  HCl; Take 0.5 tablets (25 mg total) by mouth at bedtime as needed for sleep.  Dispense: 45 tablet; Refill: 1     Follow-up: Return in about 3 months (around 02/16/2025).  Debby Molt, MD "

## 2024-11-18 NOTE — Patient Instructions (Signed)
 Hypertension, Adult High blood pressure (hypertension) is when the force of blood pumping through the arteries is too strong. The arteries are the blood vessels that carry blood from the heart throughout the body. Hypertension forces the heart to work harder to pump blood and may cause arteries to become narrow or stiff. Untreated or uncontrolled hypertension can lead to a heart attack, heart failure, a stroke, kidney disease, and other problems. A blood pressure reading consists of a higher number over a lower number. Ideally, your blood pressure should be below 120/80. The first ("top") number is called the systolic pressure. It is a measure of the pressure in your arteries as your heart beats. The second ("bottom") number is called the diastolic pressure. It is a measure of the pressure in your arteries as the heart relaxes. What are the causes? The exact cause of this condition is not known. There are some conditions that result in high blood pressure. What increases the risk? Certain factors may make you more likely to develop high blood pressure. Some of these risk factors are under your control, including: Smoking. Not getting enough exercise or physical activity. Being overweight. Having too much fat, sugar, calories, or salt (sodium) in your diet. Drinking too much alcohol. Other risk factors include: Having a personal history of heart disease, diabetes, high cholesterol, or kidney disease. Stress. Having a family history of high blood pressure and high cholesterol. Having obstructive sleep apnea. Age. The risk increases with age. What are the signs or symptoms? High blood pressure may not cause symptoms. Very high blood pressure (hypertensive crisis) may cause: Headache. Fast or irregular heartbeats (palpitations). Shortness of breath. Nosebleed. Nausea and vomiting. Vision changes. Severe chest pain, dizziness, and seizures. How is this diagnosed? This condition is diagnosed by  measuring your blood pressure while you are seated, with your arm resting on a flat surface, your legs uncrossed, and your feet flat on the floor. The cuff of the blood pressure monitor will be placed directly against the skin of your upper arm at the level of your heart. Blood pressure should be measured at least twice using the same arm. Certain conditions can cause a difference in blood pressure between your right and left arms. If you have a high blood pressure reading during one visit or you have normal blood pressure with other risk factors, you may be asked to: Return on a different day to have your blood pressure checked again. Monitor your blood pressure at home for 1 week or longer. If you are diagnosed with hypertension, you may have other blood or imaging tests to help your health care provider understand your overall risk for other conditions. How is this treated? This condition is treated by making healthy lifestyle changes, such as eating healthy foods, exercising more, and reducing your alcohol intake. You may be referred for counseling on a healthy diet and physical activity. Your health care provider may prescribe medicine if lifestyle changes are not enough to get your blood pressure under control and if: Your systolic blood pressure is above 130. Your diastolic blood pressure is above 80. Your personal target blood pressure may vary depending on your medical conditions, your age, and other factors. Follow these instructions at home: Eating and drinking  Eat a diet that is high in fiber and potassium, and low in sodium, added sugar, and fat. An example of this eating plan is called the DASH diet. DASH stands for Dietary Approaches to Stop Hypertension. To eat this way: Eat  plenty of fresh fruits and vegetables. Try to fill one half of your plate at each meal with fruits and vegetables. Eat whole grains, such as whole-wheat pasta, brown rice, or whole-grain bread. Fill about one  fourth of your plate with whole grains. Eat or drink low-fat dairy products, such as skim milk or low-fat yogurt. Avoid fatty cuts of meat, processed or cured meats, and poultry with skin. Fill about one fourth of your plate with lean proteins, such as fish, chicken without skin, beans, eggs, or tofu. Avoid pre-made and processed foods. These tend to be higher in sodium, added sugar, and fat. Reduce your daily sodium intake. Many people with hypertension should eat less than 1,500 mg of sodium a day. Do not drink alcohol if: Your health care provider tells you not to drink. You are pregnant, may be pregnant, or are planning to become pregnant. If you drink alcohol: Limit how much you have to: 0-1 drink a day for women. 0-2 drinks a day for men. Know how much alcohol is in your drink. In the [place] one drink equals one 12 oz bottle of beer (355 mL), one 5 oz glass of wine (148 mL), or one 1 oz glass of hard liquor (44 mL). Lifestyle  Work with your health care provider to maintain a healthy body weight or to lose weight. Ask what an ideal weight is for you. Get at least 30 minutes of exercise that causes your heart to beat faster (aerobic exercise) most days of the week. Activities may include walking, swimming, or biking. Include exercise to strengthen your muscles (resistance exercise), such as Pilates or lifting weights, as part of your weekly exercise routine. Try to do these types of exercises for 30 minutes at least 3 days a week. Do not use any products that contain nicotine or tobacco. These products include cigarettes, chewing tobacco, and vaping devices, such as e-cigarettes. If you need help quitting, ask your health care provider. Monitor your blood pressure at home as told by your health care provider. Keep all follow-up visits. This is important. Medicines Take over-the-counter and prescription medicines only as told by your health care provider. Follow directions carefully. Blood  pressure medicines must be taken as prescribed. Do not skip doses of blood pressure medicine. Doing this puts you at risk for problems and can make the medicine less effective. Ask your health care provider about side effects or reactions to medicines that you should watch for. Contact a health care provider if you: Think you are having a reaction to a medicine you are taking. Have headaches that keep coming back (recurring). Feel dizzy. Have swelling in your ankles. Have trouble with your vision. Get help right away if you: Develop a severe headache or confusion. Have unusual weakness or numbness. Feel faint. Have severe pain in your chest or abdomen. Vomit repeatedly. Have trouble breathing. These symptoms may be an emergency. Get help right away. Call 911. Do not wait to see if the symptoms will go away. Do not drive yourself to the hospital. Summary Hypertension is when the force of blood pumping through your arteries is too strong. If this condition is not controlled, it may put you at risk for serious complications. Your personal target blood pressure may vary depending on your medical conditions, your age, and other factors. For most people, a normal blood pressure is less than 120/80. Hypertension is treated with lifestyle changes, medicines, or a combination of both. Lifestyle changes include losing weight, eating a healthy,  low-sodium diet, exercising more, and limiting alcohol. This information is not intended to replace advice given to you by your health care provider. Make sure you discuss any questions you have with your health care provider. Document Revised: 08/22/2021 Document Reviewed: 08/22/2021 Elsevier Patient Education  2024 [employer].

## 2024-11-19 ENCOUNTER — Telehealth: Payer: Self-pay | Admitting: Internal Medicine

## 2024-11-19 NOTE — Progress Notes (Signed)
 Care Guide Pharmacy Note  11/19/2024 Name: Angel Kim MRN: [id number] DOB: 20-Jan-1941  Referred By: Joshua Debby CROME, MD Reason for referral: Call Attempt #1 and Complex Care Management (Outreach to sch ref w/ pharm/)   Angel Kim is a 84 y.o. year old female who is a primary care patient of Joshua Debby CROME, MD.  Angel Kim Knee was referred to the pharmacist for assistance related to: HTN  Successful contact was made with the patient to discuss pharmacy services including being ready for the pharmacist to call at least 5 minutes before the scheduled appointment time and to have medication bottles and any blood pressure readings ready for review. The patient agreed to meet with the pharmacist via telephone visit on 12/03/24.  Angel Kim [facility], [name]: [phone]  Fax: [phone]

## 2024-12-03 ENCOUNTER — Other Ambulatory Visit: Admitting: Pharmacist

## 2024-12-03 DIAGNOSIS — E876 Hypokalemia: Secondary | ICD-10-CM

## 2024-12-03 DIAGNOSIS — I10 Essential (primary) hypertension: Secondary | ICD-10-CM

## 2024-12-03 NOTE — Patient Instructions (Signed)
 It was a pleasure speaking with you today!  Continue your current regimen.  Continue monitoring your blood pressure at home and monitor for worsened dizziness or lightheadedness. Contact us  if these symptoms worsen.  Come 12/08/24 for walk-in lab work. No appointment is needed. I will contact you after I receive the results.  Feel free to call with any questions or concerns!  Darrelyn Drum, PharmD, BCPS, CPP Clinical Pharmacist Practitioner [facility] [phone]

## 2024-12-03 NOTE — Progress Notes (Signed)
 "  12/03/2024 Name: Angel Kim MRN: [id number] DOB: 01-24-1941  Chief Complaint  Patient presents with   Hypertension   Medication Management    Angel Kim is a 84 y.o. year old female who presented for a telephone visit.   They were referred to the pharmacist by their PCP for assistance in managing hypertension.  Subjective:  Care Team: Primary Care Provider: Joshua Debby CROME, MD ; Next Scheduled Visit: 02/16/25 Cardiologist: Dr. Kate; Next Scheduled Visit: none  Medication Access/Adherence  Current Pharmacy:  [id number] - [place], [place] - [address] [place] [address] Phone: [phone] Fax: [phone]   Patient reports affordability concerns with their medications: No  Patient reports access/transportation concerns to their pharmacy: No  Patient reports adherence concerns with their medications:  No     Hypertension: Pt notes she was not aware to stop indapamide  so she has been taking indapamide  and did start spironolactone . Confirmed she did stop potassium.  Current medications: Amlodipine  5 mg daily, Indapamide  1.25 mg 1 tablet daily, Metoprolol  50mg   BID, spironolactone  25 mg daily (started 11/18/24) Medications previously tried: Olmesartan  (d/c during hospitalization due to increase in Scr after cardiac cath) - Indapamide  was decreased during hospitalization due to bump in Scr after cardiac cath  Patient has a validated, automated, upper arm home BP cuff Current blood pressure readings readings: 1/22 2:45 pm 134/59 1/23 11:20 am 120/64 1/26 6:30 am 143/77 2/1 6:35 pm 143/71  Pt denies any change to diet/lifestyle but does not she has had increased pain due to shoulder issues. She is following with ortho for this.  Patient reports hypotensive s/sx including occasional dizziness, lightheadedness, which she states is not a new or worsened issue. Notes it has not been frequent and not significant. Denies falls. Patient reports  hypertensive symptoms including headache, chest pain, shortness of breath   Objective:  Lab Results  Component Value Date   HGBA1C 5.3 05/01/2024    Lab Results  Component Value Date   CREATININE 0.76 11/18/2024   BUN 18 11/18/2024   NA 139 11/18/2024   K 3.9 11/18/2024   CL 102 11/18/2024   CO2 30 11/18/2024    Lab Results  Component Value Date   CHOL 163 05/01/2024   HDL 47 05/01/2024   LDLCALC 89 05/01/2024   LDLDIRECT 94.0 04/29/2023   TRIG 133 05/01/2024   CHOLHDL 3.5 05/01/2024    Medications Reviewed Today     Reviewed by Merceda Lela SAUNDERS, RPH-CPP (Pharmacist) on 12/03/24 at 1029  Med List Status: <None>   Medication Order Taking? Sig Documenting Provider Last Dose Status Informant  acetaminophen  (TYLENOL ) 500 MG tablet [id number]  Take 500-1,000 mg by mouth every 6 (six) hours as needed for moderate pain (pain score 4-6). [provider]  Active Self, Pharmacy Records  amLODipine  (NORVASC ) 5 MG tablet [id number] Yes Take 5 mg by mouth daily. [provider]  Active   atorvastatin  (LIPITOR) 40 MG tablet [id number]  TAKE 1 TABLET BY MOUTH EVERY DAY Joshua Debby CROME, MD  Active   estradiol  (ESTRACE ) 0.5 MG tablet [id number]  TAKE 1 TABLET BY MOUTH EVERY DAY Joshua Debby CROME, MD  Active   Fexofenadine  HCl (ALLEGRA  ALLERGY PO) [id number]  Take 1 tablet by mouth daily. [provider]  Active Self, Pharmacy Records  indapamide  (LOZOL ) 1.25 MG tablet [id number] Yes Take 1.25 mg by mouth daily. [provider]  Active   metoprolol  tartrate (LOPRESSOR ) 50 MG tablet [id number]  Yes TAKE 1 TABLET BY MOUTH TWICE A DAY Joshua Debby CROME, MD  Active   Multiple Vitamin (MULTIVITAMIN ADULT PO) [id number]  Take 1 capsule by mouth daily. [provider]  Active Self, Pharmacy Records  spironolactone  (ALDACTONE ) 25 MG tablet [id number] Yes Take 1 tablet (25 mg total) by mouth daily. Joshua Debby CROME, MD  Active   traZODone  (DESYREL ) 50 MG tablet  [id number]  Take 0.5 tablets (25 mg total) by mouth at bedtime as needed for sleep. Joshua Debby CROME, MD  Active               Assessment/Plan:   Hypertension: - Currently controlled on average, Goal BP < 130/80 - Advised pt to monitor dizziness, notify us  if this worsens - Recommend to continue monitoring blood pressure at home  - BMP check s/p starting spironolactone , walk-in lab 2/10  Follow Up Plan: 2/10 lab, will f/u w/ results   Darrelyn Drum, PharmD, BCPS, CPP Clinical Pharmacist Practitioner [facility] [phone]   "

## 2025-02-16 ENCOUNTER — Ambulatory Visit: Admitting: Internal Medicine

## 2025-09-13 ENCOUNTER — Ambulatory Visit

## 2025-09-13 ENCOUNTER — Encounter: Admitting: Internal Medicine
